# Patient Record
Sex: Male | Born: 1950 | Race: White | Hispanic: No | Marital: Married | Smoking: Never smoker
Health system: Southern US, Community
[De-identification: ages and names within clinical notes are randomized; demographics above are authoritative.]

## PROBLEM LIST (undated history)

## (undated) DIAGNOSIS — K219 Gastro-esophageal reflux disease without esophagitis: Secondary | ICD-10-CM

## (undated) DIAGNOSIS — R519 Headache, unspecified: Secondary | ICD-10-CM

## (undated) DIAGNOSIS — Z8669 Personal history of other diseases of the nervous system and sense organs: Secondary | ICD-10-CM

## (undated) DIAGNOSIS — M1990 Unspecified osteoarthritis, unspecified site: Secondary | ICD-10-CM

## (undated) DIAGNOSIS — E785 Hyperlipidemia, unspecified: Secondary | ICD-10-CM

## (undated) DIAGNOSIS — R51 Headache: Secondary | ICD-10-CM

## (undated) HISTORY — DX: Hyperlipidemia, unspecified: E78.5

## (undated) HISTORY — PX: COLONOSCOPY: SHX174

## (undated) HISTORY — DX: Unspecified osteoarthritis, unspecified site: M19.90

## (undated) HISTORY — PX: POLYPECTOMY: SHX149

## (undated) HISTORY — DX: Personal history of other diseases of the nervous system and sense organs: Z86.69

## (undated) HISTORY — PX: KNEE ARTHROSCOPY: SUR90

---
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
## 1990-10-26 HISTORY — PX: OTHER SURGICAL HISTORY: SHX169

## 2013-06-15 ENCOUNTER — Ambulatory Visit: Payer: Self-pay | Admitting: Family Medicine

## 2013-06-28 ENCOUNTER — Ambulatory Visit (INDEPENDENT_AMBULATORY_CARE_PROVIDER_SITE_OTHER): Payer: Managed Care, Other (non HMO)

## 2013-06-28 ENCOUNTER — Encounter: Payer: Self-pay | Admitting: Physician Assistant

## 2013-06-28 ENCOUNTER — Ambulatory Visit (INDEPENDENT_AMBULATORY_CARE_PROVIDER_SITE_OTHER): Payer: Managed Care, Other (non HMO) | Admitting: Physician Assistant

## 2013-06-28 VITALS — BP 121/70 | HR 60 | Ht 71.0 in | Wt 226.0 lb

## 2013-06-28 DIAGNOSIS — Z131 Encounter for screening for diabetes mellitus: Secondary | ICD-10-CM

## 2013-06-28 DIAGNOSIS — M898X9 Other specified disorders of bone, unspecified site: Secondary | ICD-10-CM

## 2013-06-28 DIAGNOSIS — Z1211 Encounter for screening for malignant neoplasm of colon: Secondary | ICD-10-CM

## 2013-06-28 DIAGNOSIS — M25562 Pain in left knee: Secondary | ICD-10-CM

## 2013-06-28 DIAGNOSIS — IMO0002 Reserved for concepts with insufficient information to code with codable children: Secondary | ICD-10-CM

## 2013-06-28 DIAGNOSIS — Z125 Encounter for screening for malignant neoplasm of prostate: Secondary | ICD-10-CM

## 2013-06-28 DIAGNOSIS — M25569 Pain in unspecified knee: Secondary | ICD-10-CM

## 2013-06-28 DIAGNOSIS — M1710 Unilateral primary osteoarthritis, unspecified knee: Secondary | ICD-10-CM

## 2013-06-28 DIAGNOSIS — Z13220 Encounter for screening for lipoid disorders: Secondary | ICD-10-CM

## 2013-06-28 DIAGNOSIS — B079 Viral wart, unspecified: Secondary | ICD-10-CM

## 2013-06-28 MED ORDER — MELOXICAM 7.5 MG PO TABS: ORAL_TABLET | ORAL | Status: DC

## 2013-06-28 NOTE — Patient Instructions (Addendum)
Knee Pain The knee is the complex joint between your thigh and your lower leg. It is made up of bones, tendons, ligaments, and cartilage. The bones that make up the knee are:  The femur in the thigh.  The tibia and fibula in the lower leg.  The patella or kneecap riding in the groove on the lower femur. CAUSES  Knee pain is a common complaint with many causes. A few of these causes are:  Injury, such as:  A ruptured ligament or tendon injury.  Torn cartilage.  Medical conditions, such as:  Gout  Arthritis  Infections  Overuse, over training or overdoing a physical activity. Knee pain can be minor or severe. Knee pain can accompany debilitating injury. Minor knee problems often respond well to self-care measures or get well on their own. More serious injuries may need medical intervention or even surgery. SYMPTOMS The knee is complex. Symptoms of knee problems can vary widely. Some of the problems are:  Pain with movement and weight bearing.  Swelling and tenderness.  Buckling of the knee.  Inability to straighten or extend your knee.  Your knee locks and you cannot straighten it.  Warmth and redness with pain and fever.  Deformity or dislocation of the kneecap. DIAGNOSIS  Determining what is wrong may be very straight forward such as when there is an injury. It can also be challenging because of the complexity of the knee. Tests to make a diagnosis may include:  Your caregiver taking a history and doing a physical exam.  Routine X-rays can be used to rule out other problems. X-rays will not reveal a cartilage tear. Some injuries of the knee can be diagnosed by:  Arthroscopy a surgical technique by which a small video camera is inserted through tiny incisions on the sides of the knee. This procedure is used to examine and repair internal knee joint problems. Tiny instruments can be used during arthroscopy to repair the torn knee cartilage (meniscus).  Arthrography  is a radiology technique. A contrast liquid is directly injected into the knee joint. Internal structures of the knee joint then become visible on X-ray film.  An MRI scan is a non x-ray radiology procedure in which magnetic fields and a computer produce two- or three-dimensional images of the inside of the knee. Cartilage tears are often visible using an MRI scanner. MRI scans have largely replaced arthrography in diagnosing cartilage tears of the knee.  Blood work.  Examination of the fluid that helps to lubricate the knee joint (synovial fluid). This is done by taking a sample out using a needle and a syringe. TREATMENT The treatment of knee problems depends on the cause. Some of these treatments are:  Depending on the injury, proper casting, splinting, surgery or physical therapy care will be needed.  Give yourself adequate recovery time. Do not overuse your joints. If you begin to get sore during workout routines, back off. Slow down or do fewer repetitions.  For repetitive activities such as cycling or running, maintain your strength and nutrition.  Alternate muscle groups. For example if you are a weight lifter, work the upper body on one day and the lower body the next.  Either tight or weak muscles do not give the proper support for your knee. Tight or weak muscles do not absorb the stress placed on the knee joint. Keep the muscles surrounding the knee strong.  Take care of mechanical problems.  If you have flat feet, orthotics or special shoes may help.   See your caregiver if you need help.  Arch supports, sometimes with wedges on the inner or outer aspect of the heel, can help. These can shift pressure away from the side of the knee most bothered by osteoarthritis.  A brace called an "unloader" brace also may be used to help ease the pressure on the most arthritic side of the knee.  If your caregiver has prescribed crutches, braces, wraps or ice, use as directed. The acronym for  this is PRICE. This means protection, rest, ice, compression and elevation.  Nonsteroidal anti-inflammatory drugs (NSAID's), can help relieve pain. But if taken immediately after an injury, they may actually increase swelling. Take NSAID's with food in your stomach. Stop them if you develop stomach problems. Do not take these if you have a history of ulcers, stomach pain or bleeding from the bowel. Do not take without your caregiver's approval if you have problems with fluid retention, heart failure, or kidney problems.  For ongoing knee problems, physical therapy may be helpful.  Glucosamine and chondroitin are over-the-counter dietary supplements. Both may help relieve the pain of osteoarthritis in the knee. These medicines are different from the usual anti-inflammatory drugs. Glucosamine may decrease the rate of cartilage destruction.  Injections of a corticosteroid drug into your knee joint may help reduce the symptoms of an arthritis flare-up. They may provide pain relief that lasts a few months. You may have to wait a few months between injections. The injections do have a small increased risk of infection, water retention and elevated blood sugar levels.  Hyaluronic acid injected into damaged joints may ease pain and provide lubrication. These injections may work by reducing inflammation. A series of shots may give relief for as long as 6 months.  Topical painkillers. Applying certain ointments to your skin may help relieve the pain and stiffness of osteoarthritis. Ask your pharmacist for suggestions. Many over the-counter products are approved for temporary relief of arthritis pain.  In some countries, doctors often prescribe topical NSAID's for relief of chronic conditions such as arthritis and tendinitis. A review of treatment with NSAID creams found that they worked as well as oral medications but without the serious side effects. PREVENTION  Maintain a healthy weight. Extra pounds put  more strain on your joints.  Get strong, stay limber. Weak muscles are a common cause of knee injuries. Stretching is important. Include flexibility exercises in your workouts.  Be smart about exercise. If you have osteoarthritis, chronic knee pain or recurring injuries, you may need to change the way you exercise. This does not mean you have to stop being active. If your knees ache after jogging or playing basketball, consider switching to swimming, water aerobics or other low-impact activities, at least for a few days a week. Sometimes limiting high-impact activities will provide relief.  Make sure your shoes fit well. Choose footwear that is right for your sport.  Protect your knees. Use the proper gear for knee-sensitive activities. Use kneepads when playing volleyball or laying carpet. Buckle your seat belt every time you drive. Most shattered kneecaps occur in car accidents.  Rest when you are tired. SEEK MEDICAL CARE IF:  You have knee pain that is continual and does not seem to be getting better.  SEEK IMMEDIATE MEDICAL CARE IF:  Your knee joint feels hot to the touch and you have a high fever. MAKE SURE YOU:   Understand these instructions.  Will watch your condition.  Will get help right away if you are not   doing well or get worse. Document Released: 08/09/2007 Document Revised: 01/04/2012 Document Reviewed: 08/09/2007 [facility] Patient Information 2014 [place], [place].   Osteoarthritis Osteoarthritis is the most common form of arthritis. It is redness, soreness, and swelling (inflammation) affecting the cartilage. Cartilage acts as a cushion, covering the ends of bones where they meet to form a joint. CAUSES  Over time, the cartilage begins to wear away. This causes bone to rub on bone. This produces pain and stiffness in the affected joints. Factors that contribute to this problem are:  Excessive body weight.  Age.  Overuse of joints. SYMPTOMS   People with  osteoarthritis usually experience joint pain, swelling, or stiffness.  Over time, the joint may lose its normal shape.  Small deposits of bone (osteophytes) may grow on the edges of the joint.  Bits of bone or cartilage can break off and float inside the joint space. This may cause more pain and damage.  Osteoarthritis can lead to depression, anxiety, feelings of helplessness, and limitations on daily activities. The most commonly affected joints are in the:  Ends of the fingers.  Thumbs.  Neck.  Lower back.  Knees.  Hips. DIAGNOSIS  Diagnosis is mostly based on your symptoms and exam. Tests may be helpful, including:  X-rays of the affected joint.  A computerized magnetic scan (MRI).  Blood tests to rule out other types of arthritis.  Joint fluid tests. This involves using a needle to draw fluid from the joint and examining the fluid under a microscope. TREATMENT  Goals of treatment are to control pain, improve joint function, maintain a normal body weight, and maintain a healthy lifestyle. Treatment approaches may include:  A prescribed exercise program with rest and joint relief.  Weight control with nutritional education.  Pain relief techniques such as:  Properly applied heat and cold.  Electric pulses delivered to nerve endings under the skin (transcutaneous electrical nerve stimulation, TENS).  Massage.  Certain supplements. Ask your caregiver before using any supplements, especially in combination with prescribed drugs.  Medicines to control pain, such as:  Acetaminophen.  Nonsteroidal anti-inflammatory drugs (NSAIDs), such as naproxen.  Narcotic or central-acting agents, such as tramadol. This drug carries a risk of addiction and is generally prescribed for short-term use.  Corticosteroids. These can be given orally or as injection. This is a short-term treatment, not recommended for routine use.  Surgery to reposition the bones and relieve pain  (osteotomy) or to remove loose pieces of bone and cartilage. Joint replacement may be needed in advanced states of osteoarthritis. HOME CARE INSTRUCTIONS  Your caregiver can recommend specific types of exercise. These may include:  Strengthening exercises. These are done to strengthen the muscles that support joints affected by arthritis. They can be performed with weights or with exercise bands to add resistance.  Aerobic activities. These are exercises, such as brisk walking or low-impact aerobics, that get your heart pumping. They can help keep your lungs and circulatory system in shape.  Range-of-motion activities. These keep your joints limber.  Balance and agility exercises. These help you maintain daily living skills. Learning about your condition and being actively involved in your care will help improve the course of your osteoarthritis. SEEK MEDICAL CARE IF:   You feel hot or your skin turns red.  You develop a rash in addition to your joint pain.  You have an oral temperature above 102 F (38.9 C). FOR MORE INFORMATION  National Institute of Arthritis and Musculoskeletal and Skin Diseases: www.niams.http://www.myers.net/ National Institute  on Aging: https://walker.com/ [employer]: www.rheumatology.org Document Released: 10/12/2005 Document Revised: 01/04/2012 Document Reviewed: 01/23/2010 [facility] Patient Information 2014 [place], [place].

## 2013-06-29 LAB — COMPLETE METABOLIC PANEL WITH GFR
ALT: 19 U/L (ref 0–53)
AST: 19 U/L (ref 0–37)
Albumin: 4 g/dL (ref 3.5–5.2)
Alkaline Phosphatase: 46 U/L (ref 39–117)
BUN: 17 mg/dL (ref 6–23)
CO2: 28 mEq/L (ref 19–32)
Calcium: 9 mg/dL (ref 8.4–10.5)
Chloride: 103 mEq/L (ref 96–112)
Creat: 0.98 mg/dL (ref 0.50–1.35)
GFR, Est African American: >89 mL/min
GFR, Est Non African American: 83 mL/min
Glucose, Bld: 82 mg/dL (ref 70–99)
Potassium: 3.9 mEq/L (ref 3.5–5.3)
Sodium: 138 mEq/L (ref 135–145)
Total Bilirubin: 0.6 mg/dL (ref 0.3–1.2)
Total Protein: 6.6 g/dL (ref 6.0–8.3)

## 2013-06-29 LAB — LIPID PANEL
Cholesterol: 167 mg/dL (ref 0–200)
HDL: 45 mg/dL (ref >39)
LDL Cholesterol: 102 mg/dL — ABNORMAL HIGH (ref 0–99)
Total CHOL/HDL Ratio: 3.7 Ratio
Triglycerides: 99 mg/dL (ref <150)
VLDL: 20 mg/dL (ref 0–40)

## 2013-06-29 LAB — PSA, TOTAL AND FREE
PSA, Free Pct: 15 % — ABNORMAL LOW (ref >25)
PSA, Free: 0.35 ng/mL
PSA: 2.29 ng/mL (ref <=4.00)

## 2013-06-30 ENCOUNTER — Encounter: Payer: Self-pay | Admitting: Physician Assistant

## 2013-06-30 NOTE — Progress Notes (Signed)
  Subjective:    Patient ID: Jeffery Anthony, male    DOB: 03/12/1951, 62 y.o.   MRN: [id number]  HPI Patient is a 62 year old male who presents to the clinic to establish care and to discuss some ongoing left knee pain and a lesion on his chest. Patient does not take any ongoing medications. He does take an occasional palms or Excedrin.  Patient's last colonoscopy was in 2000 and 2003 but has been over 10 years. Patient does not remember the last time he has had fasting labs done.  Patient does notice small mole/lesion pop up on the chest about couple months ago. Denies any bleeding. Does appear to be getting larger. Denies any itchy or pain. Not tried anything to make better.   Patient has ongoing left knee pain. He has ran for exercise and fun entire life. He has not been able to run due to  pain. Denies any acute injuries. 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 Knee pain has been the worst for the last 8 months. Not tried anything to make better. He reports that after running or even walking long distances will swell up.    Review of Systems  All other systems reviewed and are negative.       Objective:   Physical Exam  Constitutional: He is oriented to person, place, and time. He appears well-developed and well-nourished.  HENT:  Head: Normocephalic and atraumatic.  Cardiovascular: Normal rate, regular rhythm and normal heart sounds.   Pulmonary/Chest: Effort normal and breath sounds normal.  Musculoskeletal:  Bilateral knee ROM extension and flexion normal. No joint tenderness. Mild swelling over supramedial left knee. Tenderness to palpation over medial side of left knee. Strength 5/5 bilaterally. Patellar reflexes 5/5 bilaterally.  Neurological: He is alert and oriented to person, place, and time.  Skin: Skin is warm and dry.     Psychiatric: He has a normal mood and affect. His behavior is normal.          Assessment & Plan:  Left knee  pain- Will get xrays. I suspect some degenerative changes. Will send mobic. Discussed PT/Ice/glucosamine. Talked about injections as some point.   Wart- Cryotherapy Procedure Note  Pre-operative Diagnosis: wart  Post-operative Diagnosis: wart  Locations: left of midline of upper chest  Indications: irriation  Anesthesia: not required   Procedure Details  History of allergy to iodine: no. Pacemaker? no.  Patient informed of risks (permanent scarring, infection, light or dark discoloration, bleeding, infection, weakness, numbness and recurrence of the lesion) and benefits of the procedure and verbal informed consent obtained.  The areas are treated with liquid nitrogen therapy, frozen until ice ball extended 2 mm beyond lesion, allowed to thaw, and treated again. The patient tolerated procedure well.  The patient was instructed on post-op care, warned that there may be blister formation, redness and pain. Recommend OTC analgesia as needed for pain.  Condition: Stable  Complications: none.  Plan: 1. Instructed to keep the area dry and covered for 24-48h and clean thereafter. 2. Warning signs of infection were reviewed.   3. Recommended that the patient use OTC acetaminophen as needed for pain.  4. Return in 2 weeks if not completely gone can refreeze.   Screening labs order/no urinary symptoms over 50 will order PsA.  Pt needs colonoscopy will refer.

## 2013-07-10 ENCOUNTER — Ambulatory Visit (INDEPENDENT_AMBULATORY_CARE_PROVIDER_SITE_OTHER): Payer: Managed Care, Other (non HMO) | Admitting: Sports Medicine

## 2013-07-10 ENCOUNTER — Encounter: Payer: Self-pay | Admitting: Sports Medicine

## 2013-07-10 VITALS — BP 113/70 | HR 62 | Wt 229.0 lb

## 2013-07-10 DIAGNOSIS — M170 Bilateral primary osteoarthritis of knee: Secondary | ICD-10-CM | POA: Insufficient documentation

## 2013-07-10 DIAGNOSIS — IMO0002 Reserved for concepts with insufficient information to code with codable children: Secondary | ICD-10-CM

## 2013-07-10 DIAGNOSIS — M1710 Unilateral primary osteoarthritis, unspecified knee: Secondary | ICD-10-CM

## 2013-07-10 NOTE — Assessment & Plan Note (Signed)
Has not yet tried any conservative measures. He will start Mobic which his primary provider has prescribed, home exercises, icing, knee compression sleeve. Return to see me in approximately one to 2 months, if no better we can start interventional treatment.

## 2013-07-10 NOTE — Progress Notes (Signed)
   Subjective:    I'm seeing this patient as a consultation for:  Tandy Gaw, PA-C  CC: Left knee pain  HPI: This is a very pleasant 62 year old male with a long history of left knee pain associated with swelling without mechanical symptoms. Pain is predominately along the medial joint line, without radiation. Moderate, persistent, associated with gelling and morning stiffness. He has some meloxicam prescribed by his primary provider which he has not yet taken. He does desire to continue running, but has been unable to do to his pain. He has never had injections, never had this for supplementation.  Past medical history, Surgical history, Family history not pertinant except as noted below, Social history, Allergies, and medications have been entered into the medical record, reviewed, and no changes needed.   Review of Systems: No headache, visual changes, nausea, vomiting, diarrhea, constipation, dizziness, abdominal pain, skin rash, fevers, chills, night sweats, weight loss, swollen lymph nodes, body aches, joint swelling, muscle aches, chest pain, shortness of breath, mood changes, visual or auditory hallucinations.   Objective:   General: Well Developed, well nourished, and in no acute distress.  Neuro/Psych: Alert and oriented x3, extra-ocular muscles intact, able to move all 4 extremities, sensation grossly intact. Skin: Warm and dry, no rashes noted.  Respiratory: Not using accessory muscles, speaking in full sentences, trachea midline.  Cardiovascular: Pulses palpable, no extremity edema. Abdomen: Does not appear distended. Left Knee: Visibly swollen with tenderness to palpation along the medial joint line. ROM full in flexion and extension and lower leg rotation. Ligaments with solid consistent endpoints including ACL, PCL, LCL, MCL. Negative Mcmurray's, Apley's, and Thessalonian tests. Non painful patellar compression. Patellar glide without crepitus. Patellar and quadriceps  tendons unremarkable. Hamstring and quadriceps strength is normal.   X-rays were reviewed personally, and the left knee there is significant cartilage loss in the medial compartment.  Impression and Recommendations:   This case required medical decision making of moderate complexity.

## 2013-07-20 ENCOUNTER — Encounter: Payer: Self-pay | Admitting: Internal Medicine

## 2013-09-15 ENCOUNTER — Ambulatory Visit (AMBULATORY_SURGERY_CENTER): Payer: Self-pay | Admitting: *Deleted

## 2013-09-15 VITALS — Ht 71.0 in | Wt 237.4 lb

## 2013-09-15 DIAGNOSIS — Z1211 Encounter for screening for malignant neoplasm of colon: Secondary | ICD-10-CM

## 2013-09-15 MED ORDER — MOVIPREP 100 G PO SOLR: ORAL | Status: DC

## 2013-09-15 NOTE — Progress Notes (Signed)
No allergies to eggs or soy. No problems with anesthesia.  

## 2013-09-20 ENCOUNTER — Encounter: Payer: Self-pay | Admitting: Internal Medicine

## 2013-09-29 ENCOUNTER — Ambulatory Visit (AMBULATORY_SURGERY_CENTER): Payer: Managed Care, Other (non HMO) | Admitting: Internal Medicine

## 2013-09-29 ENCOUNTER — Encounter: Payer: Self-pay | Admitting: Internal Medicine

## 2013-09-29 VITALS — BP 126/79 | HR 54 | Temp 97.9°F | Resp 14 | Ht 71.0 in | Wt 237.0 lb

## 2013-09-29 DIAGNOSIS — D126 Benign neoplasm of colon, unspecified: Secondary | ICD-10-CM

## 2013-09-29 DIAGNOSIS — Z1211 Encounter for screening for malignant neoplasm of colon: Secondary | ICD-10-CM

## 2013-09-29 MED ORDER — SODIUM CHLORIDE 0.9 % IV SOLN: 500.0000 mL | INTRAVENOUS | Status: DC

## 2013-09-29 NOTE — Patient Instructions (Signed)
YOU HAD AN ENDOSCOPIC PROCEDURE TODAY AT THE [facility]: Refer to the procedure report that was given to you for any specific questions about what was found during the examination.  If the procedure report does not answer your questions, please call your gastroenterologist to clarify.  If you requested that your care partner not be given the details of your procedure findings, then the procedure report has been included in a sealed envelope for you to review at your convenience later.  YOU SHOULD EXPECT: Some feelings of bloating in the abdomen. Passage of more gas than usual.  Walking can help get rid of the air that was put into your GI tract during the procedure and reduce the bloating. If you had a lower endoscopy (such as a colonoscopy or flexible sigmoidoscopy) you may notice spotting of blood in your stool or on the toilet paper. If you underwent a bowel prep for your procedure, then you may not have a normal bowel movement for a few days.  DIET: Your first meal following the procedure should be a light meal and then it is ok to progress to your normal diet.  A half-sandwich or bowl of soup is an example of a good first meal.  Heavy or fried foods are harder to digest and may make you feel nauseous or bloated.  Likewise meals heavy in dairy and vegetables can cause extra gas to form and this can also increase the bloating.  Drink plenty of fluids but you should avoid alcoholic beverages for 24 hours.  ACTIVITY: Your care partner should take you home directly after the procedure.  You should plan to take it easy, moving slowly for the rest of the day.  You can resume normal activity the day after the procedure however you should NOT DRIVE or use heavy machinery for 24 hours (because of the sedation medicines used during the test).    SYMPTOMS TO REPORT IMMEDIATELY: A gastroenterologist can be reached at any hour.  During normal business hours, 8:30 AM to 5:00 PM Monday through Friday,  call [phone].  After hours and on weekends, please call the GI answering service at [phone] who will take a message and have the physician on call contact you.   Following lower endoscopy (colonoscopy or flexible sigmoidoscopy):  Excessive amounts of blood in the stool  Significant tenderness or worsening of abdominal pains  Swelling of the abdomen that is new, acute  Fever of 100F or higher  FOLLOW UP: If any biopsies were taken you will be contacted by phone or by letter within the next 1-3 weeks.  Call your gastroenterologist if you have not heard about the biopsies in 3 weeks.  Our staff will call the home number listed on your records the next business day following your procedure to check on you and address any questions or concerns that you may have at that time regarding the information given to you following your procedure. This is a courtesy call and so if there is no answer at the home number and we have not heard from you through the emergency physician on call, we will assume that you have returned to your regular daily activities without incident.  SIGNATURES/CONFIDENTIALITY: You and/or your care partner have signed paperwork which will be entered into your electronic medical record.  These signatures attest to the fact that that the information above on your After Visit Summary has been reviewed and is understood.  Full responsibility of the confidentiality of this   discharge information lies with you and/or your care-partner.  Recommendations See procedure report  

## 2013-09-29 NOTE — Op Note (Signed)
[facility] [address] [place], [address]   COLONOSCOPY PROCEDURE REPORT  PATIENT: Jeffery, Anthony  MR#: [id number] BIRTHDATE: 09-29-1951 , 62  yrs. old GENDER: Male ENDOSCOPIST: Roxy Cedar, MD REFERRED NW:[facility] PROCEDURE DATE:  09/29/2013 PROCEDURE:   Colonoscopy with snare polypectomy x 1 First Screening Colonoscopy - Avg.  risk and is 50 yrs.  old or older - No.  Prior Negative Screening - Now for repeat screening. N/A  History of Adenoma - Now for follow-up colonoscopy & has been > or = to 3 yrs.  N/A  Polyps Removed Today? Yes. ASA CLASS:   Class II INDICATIONS:average risk screening. Reports screening colonoscopy elsewhere 10-12 years ago. Results unknown. MEDICATIONS: MAC sedation, administered by CRNA and propofol (Diprivan) 300mg  IV  DESCRIPTION OF PROCEDURE:   After the risks benefits and alternatives of the procedure were thoroughly explained, informed consent was obtained.  A digital rectal exam revealed no abnormalities of the rectum.   The LB [id number]  endoscope was introduced through the anus and advanced to the cecum, which was identified by both the appendix and ileocecal valve. No adverse events experienced.   The quality of the prep was good, using MoviPrep  The instrument was then slowly withdrawn as the colon was fully examined.   COLON FINDINGS: A single polyp measuring 5 mm in size was found in the sigmoid colon.  A polypectomy was performed with a cold snare. The resection was complete and the polyp tissue was completely retrieved.   The colon was otherwise normal.  There was no diverticulosis, inflammation, other polyps or cancers.  Retroflexed views revealed internal hemorrhoids. The time to cecum=3 minutes 28 seconds.  Withdrawal time=12 minutes 11 seconds.  The scope was withdrawn and the procedure completed.  COMPLICATIONS: There were no complications.  ENDOSCOPIC IMPRESSION: 1.   Single polyp  measuring 5 mm in size was found in the sigmoid colon; polypectomy was performed with a cold snare 2.   The colon was otherwise normal  RECOMMENDATIONS: 1. Repeat colonoscopy in 5 years if polyp adenomatous; otherwise 10 years   eSigned:  Roxy Cedar, MD 09/29/2013 11:00 AM   cc: The Patient    ; Tandy Gaw, PA-C [facility])

## 2013-09-29 NOTE — Progress Notes (Signed)
Called to room to assist during endoscopic procedure.  Patient ID and intended procedure confirmed with present staff. Received instructions for my participation in the procedure from the performing physician.  

## 2013-09-29 NOTE — Progress Notes (Signed)
Patient did not have preoperative order for IV antibiotic SSI prophylaxis. (G8918)  Patient did not experience any of the following events: a burn prior to discharge; a fall within the facility; wrong site/side/patient/procedure/implant event; or a hospital transfer or hospital admission upon discharge from the facility. (G8907)  

## 2013-10-02 ENCOUNTER — Telehealth: Payer: Self-pay | Admitting: *Deleted

## 2013-10-02 NOTE — Telephone Encounter (Signed)
Left message that we called for f/u 

## 2013-10-03 ENCOUNTER — Encounter: Payer: Self-pay | Admitting: Internal Medicine

## 2013-10-04 ENCOUNTER — Encounter: Payer: Self-pay | Admitting: Physician Assistant

## 2013-10-04 DIAGNOSIS — K635 Polyp of colon: Secondary | ICD-10-CM | POA: Insufficient documentation

## 2016-06-22 ENCOUNTER — Ambulatory Visit (INDEPENDENT_AMBULATORY_CARE_PROVIDER_SITE_OTHER): Payer: Managed Care, Other (non HMO)

## 2016-06-22 ENCOUNTER — Ambulatory Visit (INDEPENDENT_AMBULATORY_CARE_PROVIDER_SITE_OTHER): Payer: Managed Care, Other (non HMO) | Admitting: Physician Assistant

## 2016-06-22 ENCOUNTER — Encounter: Payer: Self-pay | Admitting: Physician Assistant

## 2016-06-22 VITALS — BP 118/86 | HR 78 | Ht 71.0 in | Wt 247.0 lb

## 2016-06-22 DIAGNOSIS — R05 Cough: Secondary | ICD-10-CM

## 2016-06-22 DIAGNOSIS — R059 Cough, unspecified: Secondary | ICD-10-CM

## 2016-06-22 MED ORDER — HYDROCOD POLST-CPM POLST ER 10-8 MG/5ML PO SUER: 5.0000 mL | Freq: Two times a day (BID) | ORAL | 0 refills | Status: DC | PRN

## 2016-06-22 MED ORDER — PREDNISONE 20 MG PO TABS: ORAL_TABLET | ORAL | 0 refills | Status: DC

## 2016-06-22 NOTE — Progress Notes (Signed)
   Subjective:    Patient ID: Jeffery Anthony, male    DOB: 11/28/1950, 65 y.o.   MRN: [id number]  HPI  Patient is a 65 year old male who presents to the clinic with ongoing cough for the last month. [facility] has [facility] for similar symptoms. He was diagnosed with a sinus infection and then pneumonia. He has taken Augmentin for 10 days and a Z-Pak for 5 days. He was given Ladona Ridgel for cough. He denies any fever, chills, wheezing or shortness of breath. Cough is not productive. It is affecting his ability to sleep. It is also affecting his ability to go to work since he is coughing all the time.   Review of Systems See HPI.     Objective:   Physical Exam  Constitutional: He is oriented to person, place, and time. He appears well-developed and well-nourished.  HENT:  Head: Normocephalic and atraumatic.  Right Ear: External ear normal.  Left Ear: External ear normal.  Nose: Nose normal.  Mouth/Throat: Oropharynx is clear and moist. No oropharyngeal exudate.  TM"s clear.  Negative for sinus tenderness to palpation.   Eyes: Conjunctivae are normal. Right eye exhibits no discharge. Left eye exhibits no discharge.  Neck: Normal range of motion. Neck supple.  Cardiovascular: Normal rate, regular rhythm and normal heart sounds.   Pulmonary/Chest: Effort normal and breath sounds normal. He has no wheezes.  Lymphadenopathy:    He has no cervical adenopathy.  Neurological: He is alert and oriented to person, place, and time.  Psychiatric: He has a normal mood and affect. His behavior is normal.          Assessment & Plan:  Cough- Reassured patient that his exam today was benign. I would like to go ahead and get a chest x-ray since he has not had this indents [facility]. I do not see any signs of infection. I would like for him to try round of prednisone. Also gave him a cough syrup to use at bedtime. HO given. Follow up if not improving.

## 2016-06-22 NOTE — Patient Instructions (Signed)

## 2016-07-28 ENCOUNTER — Ambulatory Visit (INDEPENDENT_AMBULATORY_CARE_PROVIDER_SITE_OTHER): Payer: Managed Care, Other (non HMO) | Admitting: Sports Medicine

## 2016-07-28 ENCOUNTER — Encounter: Payer: Self-pay | Admitting: Sports Medicine

## 2016-07-28 DIAGNOSIS — M170 Bilateral primary osteoarthritis of knee: Secondary | ICD-10-CM | POA: Diagnosis not present

## 2016-07-28 NOTE — Assessment & Plan Note (Signed)
Injection into the left knee. He will work on weight loss.  Return in one month. Declines oral medication.

## 2016-07-28 NOTE — Progress Notes (Signed)
   Subjective:    I'm seeing this patient as a consultation for:  Iran Planas, PA-C  CC: Knee pain  HPI: This is a pleasant 65 year old male, treated 3 years ago for primary osteoarthritis of both knees, he really wasn't compliant with rehabilitation exercises or medications, having persistent pain, medial joint line, moderate, persistent without radiation, no mechanical symptoms, no trauma. Significant gelling.  Past medical history:  Negative.  See flowsheet/record as well for more information.  Surgical history: Negative.  See flowsheet/record as well for more information.  Family history: Negative.  See flowsheet/record as well for more information.  Social history: Negative.  See flowsheet/record as well for more information.  Allergies, and medications have been entered into the medical record, reviewed, and no changes needed.   Review of Systems: No headache, visual changes, nausea, vomiting, diarrhea, constipation, dizziness, abdominal pain, skin rash, fevers, chills, night sweats, weight loss, swollen lymph nodes, body aches, joint swelling, muscle aches, chest pain, shortness of breath, mood changes, visual or auditory hallucinations.   Objective:   General: Well Developed, well nourished, and in no acute distress.  Neuro/Psych: Alert and oriented x3, extra-ocular muscles intact, able to move all 4 extremities, sensation grossly intact. Skin: Warm and dry, no rashes noted.  Respiratory: Not using accessory muscles, speaking in full sentences, trachea midline.  Cardiovascular: Pulses palpable, no extremity edema. Abdomen: Does not appear distended. Left Knee: Normal to inspection with no erythema or effusion or obvious bony abnormalities. Palpation normal with no warmth or joint line tenderness or patellar tenderness or condyle tenderness. ROM normal in flexion and extension and lower leg rotation. Ligaments with solid consistent endpoints including ACL, PCL, LCL,  MCL. Negative Mcmurray's and provocative meniscal tests. Non painful patellar compression. Patellar and quadriceps tendons unremarkable. Hamstring and quadriceps strength is normal.  Procedure: Real-time Ultrasound Guided Injection of left knee Device: GE Logiq E  Verbal informed consent obtained.  Time-out conducted.  Noted no overlying erythema, induration, or other signs of local infection.  Skin prepped in a sterile fashion.  Local anesthesia: Topical Ethyl chloride.  With sterile technique and under real time ultrasound guidance:  1 mL kenalog 40, 2 mL lidocaine, 2 mL Marcaine injected easily Completed without difficulty  Pain immediately resolved suggesting accurate placement of the medication.  Advised to call if fevers/chills, erythema, induration, drainage, or persistent bleeding.  Images permanently stored and available for review in the ultrasound unit.  Impression: Technically successful ultrasound guided injection.  Impression and Recommendations:   This case required medical decision making of moderate complexity.  Primary osteoarthritis of both knees Injection into the left knee. He will work on weight loss.  Return in one month. Declines oral medication.

## 2016-08-25 ENCOUNTER — Ambulatory Visit (INDEPENDENT_AMBULATORY_CARE_PROVIDER_SITE_OTHER): Payer: Managed Care, Other (non HMO) | Admitting: Sports Medicine

## 2016-08-25 ENCOUNTER — Encounter: Payer: Self-pay | Admitting: Sports Medicine

## 2016-08-25 DIAGNOSIS — M170 Bilateral primary osteoarthritis of knee: Secondary | ICD-10-CM

## 2016-08-25 NOTE — Assessment & Plan Note (Signed)
Left knee injection worked well, nearly pain-free. He has also lost 10 pounds. He will return for custom orthotics, essentially pain-free with all activities except for running. Declines NSAIDs.

## 2016-08-25 NOTE — Progress Notes (Signed)
  Subjective:    CC: Follow-up  HPI: This is a pleasant 65 year old male, we injected his left knee at the last visit, he is pain-free for all activities with the exception of running, agrees to proceed with custom orthotics. He is also lost 10 pounds, declines oral NSAIDs.  Past medical history:  Negative.  See flowsheet/record as well for more information.  Surgical history: Negative.  See flowsheet/record as well for more information.  Family history: Negative.  See flowsheet/record as well for more information.  Social history: Negative.  See flowsheet/record as well for more information.  Allergies, and medications have been entered into the medical record, reviewed, and no changes needed.   Review of Systems: No fevers, chills, night sweats, weight loss, chest pain, or shortness of breath.   Objective:    General: Well Developed, well nourished, and in no acute distress.  Neuro: Alert and oriented x3, extra-ocular muscles intact, sensation grossly intact.  HEENT: Normocephalic, atraumatic, pupils equal round reactive to light, neck supple, no masses, no lymphadenopathy, thyroid nonpalpable.  Skin: Warm and dry, no rashes. Cardiac: Regular rate and rhythm, no murmurs rubs or gallops, no lower extremity edema.  Respiratory: Clear to auscultation bilaterally. Not using accessory muscles, speaking in full sentences.  Impression and Recommendations:    Primary osteoarthritis of both knees Left knee injection worked well, nearly pain-free. He has also lost 10 pounds. He will return for custom orthotics, essentially pain-free with all activities except for running. Declines NSAIDs.  I spent 25 minutes with this patient, greater than 50% was face-to-face time counseling regarding the above diagnoses

## 2016-12-01 ENCOUNTER — Ambulatory Visit (INDEPENDENT_AMBULATORY_CARE_PROVIDER_SITE_OTHER): Payer: Commercial Managed Care - PPO | Admitting: Osteopathic Medicine

## 2016-12-01 ENCOUNTER — Encounter: Payer: Self-pay | Admitting: Osteopathic Medicine

## 2016-12-01 VITALS — BP 128/88 | HR 63 | Ht 71.0 in | Wt 247.0 lb

## 2016-12-01 DIAGNOSIS — G43811 Other migraine, intractable, with status migrainosus: Secondary | ICD-10-CM | POA: Diagnosis not present

## 2016-12-01 DIAGNOSIS — R51 Headache: Secondary | ICD-10-CM

## 2016-12-01 DIAGNOSIS — G43909 Migraine, unspecified, not intractable, without status migrainosus: Secondary | ICD-10-CM | POA: Insufficient documentation

## 2016-12-01 DIAGNOSIS — R519 Headache, unspecified: Secondary | ICD-10-CM

## 2016-12-01 MED ORDER — SUMATRIPTAN SUCCINATE 50 MG PO TABS: 50.0000 mg | ORAL_TABLET | ORAL | 1 refills | Status: DC | PRN

## 2016-12-01 MED ORDER — BUTALBITAL-APAP-CAFFEINE 50-325-40 MG PO TABS: 1.0000 | ORAL_TABLET | Freq: Two times a day (BID) | ORAL | 0 refills | Status: DC | PRN

## 2016-12-01 MED ORDER — KETOROLAC TROMETHAMINE 60 MG/2ML IM SOLN
60.0000 mg | Freq: Once | INTRAMUSCULAR | Status: AC
  Administered 2016-12-01: 60 mg via INTRAMUSCULAR

## 2016-12-01 MED ORDER — PROMETHAZINE HCL 25 MG/ML IJ SOLN
25.0000 mg | Freq: Once | INTRAMUSCULAR | Status: AC
  Administered 2016-12-01: 25 mg via INTRAMUSCULAR

## 2016-12-01 NOTE — Patient Instructions (Signed)
Plan  In office: shots of Toradol and Phenergan to help acute migraine  At pharmacy:  Imitrex: new Rx to have on hand to take at beginning of migraine  Fioricet: new Rx to continue over the next 1-2 days if needed for headache  If symptoms persist, change or worsen, we should get imaging of the brain,so  if you're not feeling better by tomorrow, or if you're worse in the meantime, please call us or go to the ER!   There are medication options for frequent migraine/tension headache prevention, if you are getting a lot of headaches, please follow up with your PCP about this issue!       Migraine Headache A migraine headache is an intense, throbbing pain on one side or both sides of the head. Migraines may also cause other symptoms, such as nausea, vomiting, and sensitivity to light and noise. What are the causes? Doing or taking certain things may also trigger migraines, such as:  Alcohol.  Smoking.  Medicines, such as:  Medicine used to treat chest pain (nitroglycerine).  Birth control pills.  Estrogen pills.  Certain blood pressure medicines.  Aged cheeses, chocolate, or caffeine.  Foods or drinks that contain nitrates, glutamate, aspartame, or tyramine.  Physical activity. Other things that may trigger a migraine include:  Menstruation.  Pregnancy.  Hunger.  Stress, lack of sleep, too much sleep, or fatigue.  Weather changes. What increases the risk? The following factors may make you more likely to experience migraine headaches:  Age. Risk increases with age.  Family history of migraine headaches.  Being Caucasian.  Depression and anxiety.  Obesity.  Being a woman.  Having a hole in the heart (patent foramen ovale) or other heart problems. What are the signs or symptoms? The main symptom of this condition is pulsating or throbbing pain. Pain may:  Happen in any area of the head, such as on one side or both sides.  Interfere with daily  activities.  Get worse with physical activity.  Get worse with exposure to bright lights or loud noises. Other symptoms may include:  Nausea.  Vomiting.  Dizziness.  General sensitivity to bright lights, loud noises, or smells. Before you get a migraine, you may get warning signs that a migraine is developing (aura). An aura may include:  Seeing flashing lights or having blind spots.  Seeing bright spots, halos, or zigzag lines.  Having tunnel vision or blurred vision.  Having numbness or a tingling feeling.  Having trouble talking.  Having muscle weakness. How is this diagnosed? A migraine headache can be diagnosed based on:  Your symptoms.  A physical exam.  Tests, such as CT scan or MRI of the head. These imaging tests can help rule out other causes of headaches.  Taking fluid from the spine (lumbar puncture) and analyzing it (cerebrospinal fluid analysis, or CSF analysis). How is this treated? A migraine headache is usually treated with medicines that:  Relieve pain.  Relieve nausea.  Prevent migraines from coming back. Treatment may also include:  Acupuncture.  Lifestyle changes like avoiding foods that trigger migraines. Follow these instructions at home: Medicines  Take over-the-counter and prescription medicines only as told by your health care provider.  Do not drive or use heavy machinery while taking prescription pain medicine.  To prevent or treat constipation while you are taking prescription pain medicine, your health care provider may recommend that you:  Drink enough fluid to keep your urine clear or pale yellow.  Take over-the-counter or  prescription medicines.  Eat foods that are high in fiber, such as fresh fruits and vegetables, whole grains, and beans.  Limit foods that are high in fat and processed sugars, such as fried and sweet foods. Lifestyle  Avoid alcohol use.  Do not use any products that contain nicotine or tobacco,  such as cigarettes and e-cigarettes. If you need help quitting, ask your health care provider.  Get at least 8 hours of sleep every night.  Limit your stress. General instructions  Keep a journal to find out what may trigger your migraine headaches. For example, write down:  What you eat and drink.  How much sleep you get.  Any change to your diet or medicines.  If you have a migraine:  Avoid things that make your symptoms worse, such as bright lights.  It may help to lie down in a dark, quiet room.  Do not drive or use heavy machinery.  Ask your health care provider what activities are safe for you while you are experiencing symptoms.  Keep all follow-up visits as told by your health care provider. This is important. Contact a health care provider if:  You develop symptoms that are different or more severe than your usual migraine symptoms. Get help right away if:  Your migraine becomes severe.  You have a fever.  You have a stiff neck.  You have vision loss.  Your muscles feel weak or like you cannot control them.  You start to lose your balance often.  You develop trouble walking.  You faint. This information is not intended to replace advice given to you by your health care provider. Make sure you discuss any questions you have with your health care provider. Document Released: 10/12/2005 Document Revised: 05/01/2016 Document Reviewed: 03/30/2016 Elsevier Interactive Patient Education  2017 [employer].

## 2016-12-01 NOTE — Progress Notes (Signed)
HPI: Jeffery Anthony is a 66 y.o. male  who presents to [facility] today, 12/01/16,  for chief complaint of:  Chief Complaint  Patient presents with  . Migraine    Headache . Context: Migraine is not noted on his problem list the patient has stated to previous providers that he occasionally suffers from migraines but has never had to be on prescription medication for this. Presents to clinic today with serious migraine symptoms.  . Location: starts at base of neck and goes up both sides into temple and into the eyes, same on both sides.  . Quality: throbbing . Assoc signs/symptoms: see ROS - standing = dizziness, sitting = nausea, sensitive to noise and light.  . Duration: 4 days  Timing: usually once per quarter  Or so, usually a day or so severe and then the second day will be a dull headache.  . Modifying factors: has tried the following OTC/Rx medications: usually the only thing that works is Excedrin or Excedrin migraine, this dulls it but still there.   Past medical history, surgical history, social history and family history reviewed.  Patient Active Problem List   Diagnosis Date Noted  . Colon polyp 10/04/2013  . Primary osteoarthritis of both knees 07/10/2013    Current medication list and allergy/intolerance information reviewed.   No current outpatient prescriptions on file prior to visit.   No current facility-administered medications on file prior to visit.    Allergies  Allergen Reactions  . Ivp Dye [Iodinated Diagnostic Agents] Nausea And Vomiting    Comment: iodine dye      Review of Systems:  Constitutional: No recent illness  HEENT: +headache, no vision change  Cardiac: No  chest pain, No  pressure, No palpitations  Respiratory:  No  shortness of breath. No  Cough  Musculoskeletal: No new myalgia/arthralgia  Neurologic: No  weakness, +Dizziness with standing   Exam:  BP 128/88   Pulse 63   Ht 5\' 11"  (1.803 m)    Wt 247 lb (112 kg)   BMI 34.45 kg/m   Constitutional: VS see above. General Appearance: alert, well-developed, well-nourished, NAD  Eyes: Normal lids and conjunctive, non-icteric sclera  Ears, Nose, Mouth, Throat: MMM, Normal external inspection ears/nares/mouth/lips/gums.  Neck: No masses, trachea midline.   Respiratory: Normal respiratory effort. no wheeze, no rhonchi, no rales  Cardiovascular: S1/S2 normal, no murmur, no rub/gallop auscultated. RRR.   Musculoskeletal: Gait normal. Symmetric and independent movement of all extremities  Neurological: Normal balance/coordination. No tremor. EOMI, PERRLA. Cerebellar reflexes intact. Motor and sensation intact and symmetric on both eyes in all 4 extremities. No cranial nerve deficit on limited exam.  Skin: warm, dry, intact.   Psychiatric: Normal judgment/insight. Normal mood and affect. Oriented x3.      ASSESSMENT/PLAN:   Other migraine with status migrainosus, intractable  Acute intractable headache, unspecified headache type - Offered CT today but patient declines. ER precautions reviewed. If no better by tomorrow we'll need to consider urgent imaging. - Plan: SUMAtriptan (IMITREX) 50 MG tablet, butalbital-acetaminophen-caffeine (FIORICET, ESGIC) 50-325-40 MG tablet, promethazine (PHENERGAN) injection 25 mg, ketorolac (TORADOL) injection 60 mg    Patient Instructions  Plan  In office: shots of Toradol and Phenergan to help acute migraine  At pharmacy:  Imitrex: new Rx to have on hand to take at beginning of migraine  Fioricet: new Rx to continue over the next 1-2 days if needed for headache  If symptoms persist, change or worsen, we should get  imaging of the brain,so  if you're not feeling better by tomorrow, or if you're worse in the meantime, please call us or go to the ER!   There are medication options for frequent migraine/tension headache prevention, if you are getting a lot of headaches, please follow up with  your PCP about this issue!       Migraine Headache A migraine headache is an intense, throbbing pain on one side or both sides of the head. Migraines may also cause other symptoms, such as nausea, vomiting, and sensitivity to light and noise. What are the causes? Doing or taking certain things may also trigger migraines, such as:  Alcohol.  Smoking.  Medicines, such as:  Medicine used to treat chest pain (nitroglycerine).  Birth control pills.  Estrogen pills.  Certain blood pressure medicines.  Aged cheeses, chocolate, or caffeine.  Foods or drinks that contain nitrates, glutamate, aspartame, or tyramine.  Physical activity. Other things that may trigger a migraine include:  Menstruation.  Pregnancy.  Hunger.  Stress, lack of sleep, too much sleep, or fatigue.  Weather changes. What increases the risk? The following factors may make you more likely to experience migraine headaches:  Age. Risk increases with age.  Family history of migraine headaches.  Being Caucasian.  Depression and anxiety.  Obesity.  Being a woman.  Having a hole in the heart (patent foramen ovale) or other heart problems. What are the signs or symptoms? The main symptom of this condition is pulsating or throbbing pain. Pain may:  Happen in any area of the head, such as on one side or both sides.  Interfere with daily activities.  Get worse with physical activity.  Get worse with exposure to bright lights or loud noises. Other symptoms may include:  Nausea.  Vomiting.  Dizziness.  General sensitivity to bright lights, loud noises, or smells. Before you get a migraine, you may get warning signs that a migraine is developing (aura). An aura may include:  Seeing flashing lights or having blind spots.  Seeing bright spots, halos, or zigzag lines.  Having tunnel vision or blurred vision.  Having numbness or a tingling feeling.  Having trouble talking.  Having  muscle weakness. How is this diagnosed? A migraine headache can be diagnosed based on:  Your symptoms.  A physical exam.  Tests, such as CT scan or MRI of the head. These imaging tests can help rule out other causes of headaches.  Taking fluid from the spine (lumbar puncture) and analyzing it (cerebrospinal fluid analysis, or CSF analysis). How is this treated? A migraine headache is usually treated with medicines that:  Relieve pain.  Relieve nausea.  Prevent migraines from coming back. Treatment may also include:  Acupuncture.  Lifestyle changes like avoiding foods that trigger migraines. Follow these instructions at home: Medicines  Take over-the-counter and prescription medicines only as told by your health care provider.  Do not drive or use heavy machinery while taking prescription pain medicine.  To prevent or treat constipation while you are taking prescription pain medicine, your health care provider may recommend that you:  Drink enough fluid to keep your urine clear or pale yellow.  Take over-the-counter or prescription medicines.  Eat foods that are high in fiber, such as fresh fruits and vegetables, whole grains, and beans.  Limit foods that are high in fat and processed sugars, such as fried and sweet foods. Lifestyle  Avoid alcohol use.  Do not use any products that contain nicotine or tobacco, such  as cigarettes and e-cigarettes. If you need help quitting, ask your health care provider.  Get at least 8 hours of sleep every night.  Limit your stress. General instructions  Keep a journal to find out what may trigger your migraine headaches. For example, write down:  What you eat and drink.  How much sleep you get.  Any change to your diet or medicines.  If you have a migraine:  Avoid things that make your symptoms worse, such as bright lights.  It may help to lie down in a dark, quiet room.  Do not drive or use heavy machinery.  Ask  your health care provider what activities are safe for you while you are experiencing symptoms.  Keep all follow-up visits as told by your health care provider. This is important. Contact a health care provider if:  You develop symptoms that are different or more severe than your usual migraine symptoms. Get help right away if:  Your migraine becomes severe.  You have a fever.  You have a stiff neck.  You have vision loss.  Your muscles feel weak or like you cannot control them.  You start to lose your balance often.  You develop trouble walking.  You faint. This information is not intended to replace advice given to you by your health care provider. Make sure you discuss any questions you have with your health care provider. Document Released: 10/12/2005 Document Revised: 05/01/2016 Document Reviewed: 03/30/2016 Elsevier Interactive Patient Education  2017 [employer].     Follow-up plan: Return if symptoms worsen or fail to improve.  Visit summary with medication list and pertinent instructions was printed for patient to review, alert [place] if any changes needed. All questions at time of visit were answered - patient instructed to contact office with any additional concerns. ER/RTC precautions were reviewed with the patient and understanding verbalized.

## 2016-12-03 ENCOUNTER — Telehealth: Payer: Self-pay | Admitting: Osteopathic Medicine

## 2016-12-03 ENCOUNTER — Telehealth: Payer: Self-pay

## 2016-12-03 DIAGNOSIS — G43911 Migraine, unspecified, intractable, with status migrainosus: Secondary | ICD-10-CM

## 2016-12-03 NOTE — Telephone Encounter (Signed)
In case needed for MRI

## 2016-12-03 NOTE — Telephone Encounter (Signed)
In that case, he needs imaging, preferably MRI. Is there anywhere we can get him in for an urgent MRI brain with & without contrast today? If not, we can get a CT downstairs. Can we check up on this?

## 2016-12-03 NOTE — Telephone Encounter (Signed)
Left information on Pt's VM. callback provided for any questions.

## 2016-12-03 NOTE — Telephone Encounter (Signed)
Please call patient: I just received a call that MRI report showed no intracranial abnormality. If his pain is still uncontrolled he should go to the emergency room, he may require treatment with IV medications.

## 2016-12-03 NOTE — Telephone Encounter (Signed)
Jeffery Anthony, Bill's wife, called and reports his migraines is not better. He had to leave work today due to migraine. Please advise.

## 2016-12-03 NOTE — Telephone Encounter (Signed)
Patient is on his way to [facility] for MRI.

## 2017-07-06 ENCOUNTER — Ambulatory Visit (INDEPENDENT_AMBULATORY_CARE_PROVIDER_SITE_OTHER): Payer: Commercial Managed Care - PPO | Admitting: Physician Assistant

## 2017-07-06 ENCOUNTER — Encounter: Payer: Self-pay | Admitting: Physician Assistant

## 2017-07-06 VITALS — BP 122/83 | HR 72 | Ht 71.0 in | Wt 232.0 lb

## 2017-07-06 DIAGNOSIS — Z1159 Encounter for screening for other viral diseases: Secondary | ICD-10-CM

## 2017-07-06 DIAGNOSIS — T881XXA Other complications following immunization, not elsewhere classified, initial encounter: Secondary | ICD-10-CM

## 2017-07-06 DIAGNOSIS — Z131 Encounter for screening for diabetes mellitus: Secondary | ICD-10-CM | POA: Diagnosis not present

## 2017-07-06 DIAGNOSIS — G610 Guillain-Barre syndrome: Secondary | ICD-10-CM

## 2017-07-06 DIAGNOSIS — M79672 Pain in left foot: Secondary | ICD-10-CM | POA: Diagnosis not present

## 2017-07-06 DIAGNOSIS — Z0000 Encounter for general adult medical examination without abnormal findings: Secondary | ICD-10-CM

## 2017-07-06 DIAGNOSIS — M1712 Unilateral primary osteoarthritis, left knee: Secondary | ICD-10-CM

## 2017-07-06 DIAGNOSIS — G43009 Migraine without aura, not intractable, without status migrainosus: Secondary | ICD-10-CM

## 2017-07-06 DIAGNOSIS — Z13220 Encounter for screening for lipoid disorders: Secondary | ICD-10-CM

## 2017-07-06 DIAGNOSIS — Z114 Encounter for screening for human immunodeficiency virus [HIV]: Secondary | ICD-10-CM

## 2017-07-06 NOTE — Progress Notes (Signed)
Subjective:    Patient ID: Jeffery Anthony, male    DOB: [birth year], 66 y.o.   MRN: [id number]  HPI   Pt is a 66 yo male who presents to the clinic for CPE.   He is walking daily. He does have left knee pain for years. Needs knee replacement. Done injections in the past.   In the last month his left anterior foot has started to hurt. He continues to walk and play golf shich makes it worse. Not tried anything to make better.   Migraines-occurs about once a month for last 3 years. MR normal done this year. imitrex does help if takes in time. Last eye exam 2 weeks ago and normal.    .. Active Ambulatory Problems    Diagnosis Date Noted  . Primary osteoarthritis of both knees 07/10/2013  . Colon polyp 10/04/2013  . Migraine 12/01/2016  . 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 No Resolved Ambulatory Problems   Past Medical History:  Diagnosis Date  . Arthritis    .Marland Kitchen Family History  Problem Relation Age of Onset  . Hyperlipidemia Father   . Hypertension Father   . Heart attack Father   . Colon cancer Neg Hx        Review of Systems  All other systems reviewed and are negative.      Objective:   Physical Exam BP 122/83   Pulse 72   Ht 5\' 11"  (1.803 m)   Wt 232 lb (105.2 kg)   BMI 32.36 kg/m   General Appearance:    Alert, cooperative, no distress, appears stated age  Head:    Normocephalic, without obvious abnormality, atraumatic  Eyes:    PERRL, conjunctiva/corneas clear, EOM's intact, fundi    benign, both eyes       Ears:    Normal TM's and external ear canals, both ears  Nose:   Nares normal, septum midline, mucosa normal, no drainage    or sinus tenderness  Throat:   Lips, mucosa, and tongue normal; teeth and gums normal  Neck:   Supple, symmetrical, trachea midline, no adenopathy;       thyroid:  No enlargement/tenderness/nodules; no carotid   bruit or JVD  Back:      Symmetric, no curvature, ROM normal, no CVA tenderness  Lungs:     Clear to auscultation bilaterally, respirations unlabored  Chest wall:    No tenderness or deformity  Heart:    Regular rate and rhythm, S1 and S2 normal, no murmur, rub   or gallop  Abdomen:     Soft, non-tender, bowel sounds active all four quadrants,    no masses, no organomegaly        Extremities:   Extremities normal, atraumatic, no cyanosis or edema, NROM of left foot/strength 5/5/tenderness over anterior foot to palpation just below 3rd metatarsal.   Pulses:   2+ and symmetric all extremities  Skin:   Skin color, texture, turgor normal, no rashes or lesions  Lymph nodes:   Cervical, supraclavicular, and axillary nodes normal  Neurologic:   CNII-XII intact. Normal strength, sensation and reflexes      throughout         Assessment & Plan:  Marland KitchenMarland KitchenRush Anthony was seen today for annual exam.  Diagnoses and all orders for this visit:  Routine physical examination -     Hepatitis C antibody -     HIV antibody (with reflex) -  Lipid Panel w/reflex Direct LDL -     COMPLETE METABOLIC PANEL WITH GFR -     PSA, total and free -     CBC  Need for hepatitis C screening test -     Hepatitis C antibody  Encounter for screening for HIV -     HIV antibody (with reflex)  Screening for lipid disorders -     Lipid Panel w/reflex Direct LDL  Screening for diabetes mellitus -     COMPLETE METABOLIC PANEL WITH GFR  Left foot pain  Primary osteoarthritis of left knee  Migraine without aura and without status migrainosus, not intractable  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 Your vaccines are up to date.  Encouraged to  start ASA daily.   Up to date colonoscopy. AUA 4 low risk.   Left foot pain: suspect could be a stress fracture. Declines xray. Declines post op shoe. Consider good supportive shoe. Follow up as needed.   Left OA of knee- consider sports medicine follow up for orthovisc.   Migraines: MR brain normal. Has as needed rescue that does work. Use as needed. If headaches occurring more than 2 times a week could consider preventative.   Pt declines all vaccines.

## 2017-07-06 NOTE — Patient Instructions (Addendum)
Keeping you healthy  Get these tests  Blood pressure- Have your blood pressure checked once a year by your healthcare provider.  Normal blood pressure is 120/80.  Weight- Have your body mass index (BMI) calculated to screen for obesity.  BMI is a measure of body fat based on height and weight. You can also calculate your own BMI at GravelBags.it.  Cholesterol- Have your cholesterol checked regularly starting at age 65, sooner may be necessary if you have diabetes, high blood pressure, if a family member developed heart diseases at an early age or if you smoke.   Chlamydia, HIV, and other sexual transmitted disease- Get screened each year until the age of 33 then within three months of each new sexual partner.  Diabetes- Have your blood sugar checked regularly if you have high blood pressure, high cholesterol, a family history of diabetes or if you are overweight.  Get these vaccines  Flu shot- Every fall.  Tetanus shot- Every 10 years.  Menactra- Single dose; prevents meningitis.  Take these steps  Don't smoke- If you do smoke, ask your healthcare provider about quitting. For tips on how to quit, go to www.smokefree.gov or call 1-800-QUIT-NOW.  Be physically active- Exercise 5 days a week for at least 30 minutes.  If you are not already physically active start slow and gradually work up to 30 minutes of moderate physical activity.  Examples of moderate activity include walking briskly, mowing the yard, dancing, swimming bicycling, etc.  Eat a healthy diet- Eat a variety of healthy foods such as fruits, vegetables, low fat milk, low fat cheese, yogurt, lean meats, poultry, fish, beans, tofu, etc.  For more information on healthy eating, go to www.thenutritionsource.org  Drink alcohol in moderation- Limit alcohol intake two drinks or less a day.  Never drink and drive.  Dentist- Brush and floss teeth twice daily; visit your dentis twice a year.  Depression-Your emotional  health is as important as your physical health.  If you're feeling down, losing interest in things you normally enjoy please talk with your healthcare provider.  Gun Safety- If you keep a gun in your home, keep it unloaded and with the safety lock on.  Bullets should be stored separately.  Helmet use- Always wear a helmet when riding a motorcycle, bicycle, rollerblading or skateboarding.  Safe sex- If you may be exposed to a sexually transmitted infection, use a condom  Seat belts- Seat bels can save your life; always wear one.  Smoke/Carbon Monoxide detectors- These detectors need to be installed on the appropriate level of your home.  Replace batteries at least once a year.  Skin Cancer- When out in the sun, cover up and use sunscreen SPF 15 or higher.  Violence- If anyone is threatening or hurting you, please tell your healthcare provider.   Benign Prostatic Hyperplasia Benign prostatic hyperplasia is when the prostate gland is bigger than normal (enlarged). The prostate is a gland that produces the fluid that goes into semen. It is near the opening to the bladder and it surrounds the tube that drains urine out of the body (urethra). Benign prostatic hyperplasia is common among older men and it typically causes problems with urinating. The prostate grows slowly as you age. As the prostate grows, it can pinch the urethra. This causes the bladder to work too hard to pass urine, which leads to a thickened bladder wall. The bladder may eventually become weak and unable to empty completely. What are the causes? The exact cause of  this condition is not known. It may be related to changes in hormones as the body ages. What increases the risk? You are more likely to develop this condition if:  You have a family history of the condition.  You are age 34 or older.  You have a history of erectile dysfunction.  You do not exercise.  You have certain medical conditions, including: ? Type 2  diabetes. ? Obesity. ? Heart and circulatory disease.  What are the signs or symptoms? Symptoms of this condition include:  Weak or interrupted urine stream.  Dribbling or leaking urine.  Feeling like the bladder has not emptied completely.  Difficulty starting urination.  Getting up frequently at night to urinate.  Urinating more often (8 or more times a day).  Accidental loss of urine (urinary incontinence).  Pain during urination or ejaculation.  Urine with an unusual smell or color.  The size of the prostate does not always determine the severity of the symptoms. For example, a man with a large prostate may experience minor symptoms, or a man with a smaller prostate may experience a severe blockage. How is this diagnosed? This condition may be diagnosed based on:  Your medical history and symptoms.  A physical exam. This usually includes a digital rectal exam. During this exam, your health care provider places a gloved, lubricated finger into the rectum to feel the size of the prostate.  A blood test. This test checks for high levels of a protein that is produced by the prostate (prostate specific antigen, PSA).  Tests to examine how well the urethra and bladder are functioning (urodynamic tests).  Cystoscopy. For this test, a small, tube-shaped instrument (cystoscope) is used to look inside the urethra and bladder. The cystoscope is placed into the urinary tract through the opening at the tip of the penis.  Urine tests.  Ultrasound.  How is this treated? Treatment for this condition depends on how severe your symptoms are. Treatment may include:  Active surveillance or "watchful waiting." If your symptoms are mild, your health care provider may delay treatment and ask you to keep track of your symptoms. You will have regular checkups to examine the size of your prostate, discuss symptoms, and determine whether treatment is needed.  Medicines. These may be used  to: ? Stop prostate growth. ? Shrink the prostate. ? Relieve symptoms.  Lifestyle changes, including: ? Pelvic floor muscle exercises. The pelvic floor muscles are a group of muscles that relax when you urinate. ? Bladder training. This involves exercises that train the bladder to hold more urine for longer periods. ? Reducing the amount of liquid that you drink. This is especially important before sleeping and before long periods of time spent in public. ? Reducing the amount of caffeine and alcohol that you drink. ? Treating or preventing constipation.  Surgery to reduce the size of the prostate or widen the urethra. This is typically done if your symptoms are severe or there are serious complications from the enlarged prostate.  Follow these instructions at home: Medicines  Take over-the-counter and prescription medicines as told by your health care provider.  Avoid certain medicines, such as decongestants, antihistamines, and some prescription medicines as told by your health care provider. Ask your health care provider which medicines you should avoid. General instructions  Monitor your symptoms for any changes. Tell your health care provider about any changes.  Give yourself time when you urinate.  Avoid certain beverages that can irritate the bladder, such as: ?  Alcohol. ? Caffeinated drinks like coffee, tea, and cola.  Avoid drinking large amounts of liquid before bed or before going out in public.  Do pelvic floor muscle or bladder training exercises as told by your health care provider.  Keep all follow-up visits as told by your health care provider. This is important. Contact a health care provider if:  Your develop new or worse symptoms.  You have trouble getting or maintaining an erection.  You have a fever.  You have pain or burning during urination.  You have blood in your urine. Get help right away if:  You have severe pain when urinating.  You cannot  urinate.  You have severe pain in your abdomen.  You are dizzy.  You faint.  You have severe back pain.  Your urine is dark red and difficult to see through.  You have large blood clots in your urine.  You have severe pain after an erection.  You have chest pain, dizziness, or nausea during sexual activity. Summary  The prostate is a gland that produces the fluid that goes into semen. It is near the opening to the bladder and it surrounds the tube that drains urine out of the body (urethra).  Benign prostatic hyperplasia is common among older men and it typically causes problems with urinating.  If your symptoms are mild, your health care provider may delay treatment and ask you to keep track of your symptoms. You will have regular checkups to examine the size of your prostate, discuss symptoms, and determine whether treatment is needed.  If directed, you may need to avoid certain medicines, such as decongestants, antihistamines, and some prescription medicines.  Contact your health care provider if you develop new or worse symptoms. This information is not intended to replace advice given to you by your health care provider. Make sure you discuss any questions you have with your health care provider. Document Released: 10/12/2005 Document Revised: 08/31/2016 Document Reviewed: 08/31/2016 Elsevier Interactive Patient Education  2017 [employer].

## 2017-07-07 DIAGNOSIS — G610 Guillain-Barre syndrome: Secondary | ICD-10-CM | POA: Insufficient documentation

## 2017-07-07 DIAGNOSIS — T881XXA Other complications following immunization, not elsewhere classified, initial encounter: Secondary | ICD-10-CM | POA: Insufficient documentation

## 2017-07-07 NOTE — Progress Notes (Signed)
Call pt: hep C is negative.  HIV is negative.  PSA normal.  Glucose just a tad elevated. Add a1c.  Cholesterol looks good.

## 2017-07-09 LAB — COMPLETE METABOLIC PANEL WITH GFR
AG Ratio: 2 (calc) (ref 1.0–2.5)
ALT: 22 U/L (ref 9–46)
AST: 22 U/L (ref 10–35)
Albumin: 4.5 g/dL (ref 3.6–5.1)
Alkaline phosphatase (APISO): 46 U/L (ref 40–115)
BUN: 17 mg/dL (ref 7–25)
CO2: 27 mmol/L (ref 20–32)
Calcium: 9.9 mg/dL (ref 8.6–10.3)
Chloride: 107 mmol/L (ref 98–110)
Creat: 0.92 mg/dL (ref 0.70–1.25)
GFR, Est African American: 102 mL/min/{1.73_m2} (ref >=60)
GFR, Est Non African American: 88 mL/min/{1.73_m2} (ref >=60)
Globulin: 2.3 g/dL (calc) (ref 1.9–3.7)
Glucose, Bld: 100 mg/dL — ABNORMAL HIGH (ref 65–99)
Potassium: 4.4 mmol/L (ref 3.5–5.3)
Sodium: 142 mmol/L (ref 135–146)
Total Bilirubin: 0.7 mg/dL (ref 0.2–1.2)
Total Protein: 6.8 g/dL (ref 6.1–8.1)

## 2017-07-09 LAB — CBC
HCT: 43.1 % (ref 38.5–50.0)
Hemoglobin: 15.2 g/dL (ref 13.2–17.1)
MCH: 37.5 pg — ABNORMAL HIGH (ref 27.0–33.0)
MCHC: 35.3 g/dL (ref 32.0–36.0)
MCV: 106.4 fL — ABNORMAL HIGH (ref 80.0–100.0)
MPV: 10.7 fL (ref 7.5–12.5)
Platelets: 230 10*3/uL (ref 140–400)
RBC: 4.05 10*6/uL — ABNORMAL LOW (ref 4.20–5.80)
RDW: 14.7 % (ref 11.0–15.0)
WBC: 6 10*3/uL (ref 3.8–10.8)

## 2017-07-09 LAB — LIPID PANEL W/REFLEX DIRECT LDL
Cholesterol: 183 mg/dL (ref <200)
HDL: 42 mg/dL (ref >40)
LDL Cholesterol (Calc): 118 mg/dL (calc) — ABNORMAL HIGH
Non-HDL Cholesterol (Calc): 141 mg/dL (calc) — ABNORMAL HIGH (ref <130)
Total CHOL/HDL Ratio: 4.4 (calc) (ref <5.0)
Triglycerides: 121 mg/dL (ref <150)

## 2017-07-09 LAB — TEST AUTHORIZATION

## 2017-07-09 LAB — PSA, TOTAL AND FREE
PSA, % Free: 28 % (calc) (ref >25)
PSA, Free: 0.8 ng/mL
PSA, Total: 2.9 ng/mL (ref <=4.0)

## 2017-07-09 LAB — HEMOGLOBIN A1C W/OUT EAG: Hgb A1c MFr Bld: 5.6 % of total Hgb (ref <5.7)

## 2017-07-09 LAB — HIV ANTIBODY (ROUTINE TESTING W REFLEX): HIV 1&2 Ab, 4th Generation: NONREACTIVE

## 2017-07-09 LAB — HEPATITIS C ANTIBODY
Hepatitis C Ab: NONREACTIVE
SIGNAL TO CUT-OFF: 0.18 (ref <1.00)

## 2017-07-19 ENCOUNTER — Other Ambulatory Visit: Payer: Self-pay | Admitting: Physician Assistant

## 2017-07-19 DIAGNOSIS — R519 Headache, unspecified: Secondary | ICD-10-CM

## 2017-07-19 DIAGNOSIS — R51 Headache: Principal | ICD-10-CM

## 2017-11-17 ENCOUNTER — Encounter: Payer: Self-pay | Admitting: Physician Assistant

## 2017-11-17 ENCOUNTER — Ambulatory Visit (INDEPENDENT_AMBULATORY_CARE_PROVIDER_SITE_OTHER): Payer: Commercial Managed Care - PPO | Admitting: Physician Assistant

## 2017-11-17 VITALS — BP 147/76 | HR 62 | Ht 71.0 in | Wt 232.0 lb

## 2017-11-17 DIAGNOSIS — J069 Acute upper respiratory infection, unspecified: Secondary | ICD-10-CM

## 2017-11-17 DIAGNOSIS — H6981 Other specified disorders of Eustachian tube, right ear: Secondary | ICD-10-CM

## 2017-11-17 DIAGNOSIS — H8111 Benign paroxysmal vertigo, right ear: Secondary | ICD-10-CM

## 2017-11-17 MED ORDER — ALBUTEROL SULFATE (2.5 MG/3ML) 0.083% IN NEBU
2.5000 mg | INHALATION_SOLUTION | Freq: Four times a day (QID) | RESPIRATORY_TRACT | 1 refills | Status: DC | PRN
Start: 2017-11-17 — End: 2017-11-17

## 2017-11-17 MED ORDER — MECLIZINE HCL 25 MG PO TABS: 25.0000 mg | ORAL_TABLET | Freq: Three times a day (TID) | ORAL | 1 refills | Status: DC | PRN

## 2017-11-17 MED ORDER — METHYLPREDNISOLONE SODIUM SUCC 125 MG IJ SOLR
125.0000 mg | Freq: Once | INTRAMUSCULAR | Status: AC
  Administered 2017-11-17: 125 mg via INTRAMUSCULAR

## 2017-11-17 NOTE — Progress Notes (Addendum)
Subjective:    Patient ID: Jeffery Anthony, male    DOB: [birth year], 67 y.o.   MRN: [id number]  HPI  Patient is a 67 year old male presenting with acute onset dizziness, loss of balance, and sensation of being light headed. He has had sinus congestion, cough, and sore throat that began last 11/11/17. He reports these symptoms began to feel better today but then his dizziness began. He got up out of bed to go to the shower and began feeling dizzy and unbalanced on his way there. Once in the shower he describe his feeling as being lightheaded, dizzy, and unbalanced to the point he needed the wall or other objects to assist with ambulation. He has no symptoms while seated and still. He noticed that when he does walk he tends to fall towards the right side. He describes the unbalance feeling like walking in a bouncy house and does not feel like the room is spinning around. He denies vision changes. He has stayed hydrated and eating. He denies a history of CAD or chest pain. He denies any weakness. His mother also has a history of BPPV that required vestibular rehabilitation.   .. Active Ambulatory Problems    Diagnosis Date Noted  . Primary osteoarthritis of both knees 07/10/2013  . Colon polyp 10/04/2013  . Migraine 12/01/2016  . 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 No Resolved Ambulatory Problems   Past Medical History:  Diagnosis Date  . Arthritis       Review of Systems  Constitutional: Negative for chills, fatigue and fever.  HENT: Positive for congestion, postnasal drip, rhinorrhea and sinus pressure. Negative for sinus pain and sore throat.   Respiratory: Positive for cough.   Cardiovascular: Negative for chest pain and palpitations.  Gastrointestinal: Negative for abdominal pain.  Neurological: Positive for dizziness, light-headedness and headaches. Negative for weakness.  Psychiatric/Behavioral:  Negative for confusion.       Objective:   Physical Exam  Constitutional: He is oriented to person, place, and time. He appears well-developed and well-nourished. No distress.  HENT:  Head: Normocephalic and atraumatic.  Right Ear: External ear normal.  Left Ear: External ear normal.  Positive Dix-Hallpike to the right side.   Eyes: Pupils are equal, round, and reactive to light.  Neck: Neck supple.  Cardiovascular: Normal rate and regular rhythm. Exam reveals no gallop and no friction rub.  No murmur heard. Pulmonary/Chest: Effort normal and breath sounds normal.  Musculoskeletal:  Strength testing in the upper and lower extremities 5/5.   Lymphadenopathy:    He has no cervical adenopathy.  Neurological: He is alert and oriented to person, place, and time. He has normal strength.  Psychiatric: He has a normal mood and affect. His behavior is normal.   Vitals:   11/17/17 1000  BP: (!) 147/76  Pulse: 62      Assessment & Plan:   Jeffery Anthony was seen today for dizziness.  Diagnoses and all orders for this visit:  Benign paroxysmal positional vertigo of right ear -     meclizine (ANTIVERT) 25 MG tablet; Take 1 tablet (25 mg total) by mouth 3 (three) times daily as needed for dizziness or nausea.  ETD (Eustachian tube dysfunction), right -     methylPREDNISolone sodium succinate (SOLU-MEDROL) 125 mg/2 mL injection 125 mg  Viral URI -     Discontinue: albuterol (PROVENTIL) (2.5 MG/3ML) 0.083% nebulizer solution; Take 3 mLs (2.5  mg total) by nebulization every 6 (six) hours as needed for wheezing or shortness of breath. -     methylPREDNISolone sodium succinate (SOLU-MEDROL) 125 mg/2 mL injection 125 mg   Jeffery Anthony was given a handout on BPPV. I see no neurological findings suggestive of cerebellum stroke.  He has been given a handout on how to perform the Epley maneuver at home to try to relieve his symptoms. He also has been given Antivert to addess his extreme dizziness. He should not  return to work until his symptoms have resolved and it is safe to drive without symptoms. He has also been given a Solu-medrol injection to help alleviate inflammation in the upper respiratory tract and hopefully help with some suspected eustachian tube dysfunction. If he does not get better in 3 days we will consider bringing him back for a PT referral.  Symptomatic care for URI given.

## 2017-11-17 NOTE — Patient Instructions (Signed)

## 2017-11-22 ENCOUNTER — Encounter: Payer: Self-pay | Admitting: Emergency Medicine

## 2018-02-15 ENCOUNTER — Telehealth: Payer: Self-pay | Admitting: Sports Medicine

## 2018-02-15 ENCOUNTER — Ambulatory Visit: Payer: Commercial Managed Care - PPO | Admitting: Sports Medicine

## 2018-02-15 DIAGNOSIS — M170 Bilateral primary osteoarthritis of knee: Secondary | ICD-10-CM | POA: Diagnosis not present

## 2018-02-15 NOTE — Telephone Encounter (Signed)
-----   Message from Tasia Catchings, [place] sent at 02/15/2018  4:16 PM EDT ----- Information has been submitted to [facility] and awaiting determination.   ----- Message ----- From: Silverio Decamp, MD Sent: 02/15/2018   2:52 PM To: Beatris Ship Shameer [place]stad, CMA  Left knee Orthov[facility] approval, x-ray confirmed, failed steroid injections ___________________________________________ Gwen Her. Dianah Field, M.D., ABFM., CAQSM. Primary Care and [place] Instructor of [place] [facility]

## 2018-02-15 NOTE — Assessment & Plan Note (Signed)
Previous injection was about 2 years ago, having recurrence of pain, he is interested in doing Visco supplementation. He is not yet interested in oral NSAIDs. Steroid injection today, approval for Visco supplementation, rehab exercises given. Return to see me when Orthovisc is approved to start series of injections.

## 2018-02-15 NOTE — Progress Notes (Signed)
Subjective:    CC: Left knee pain  HPI: This is a pleasant 67 year old male with known left knee osteoarthritis, we last treated him about 2 years ago with an injection, he did well for about a month and a half but then had a recurrence of pain, never presented.  He feels as though he can live with it.  No mechanical symptoms, pain is mostly at the medial joint line, what brought him in today is more of a new swelling in the back of the knee.  He has significant gelling, and declines oral NSAIDs.  I reviewed the past medical history, family history, social history, surgical history, and allergies today and no changes were needed.  Please see the problem list section below in epic for further details.  Past Medical History: Past Medical History:  Diagnosis Date  . Arthritis    knees   Past Surgical History: Past Surgical History:  Procedure Laterality Date  . KNEE ARTHROSCOPY Bilateral 1992, 2008  . ruptured achilles tendon Left 1992  . TONSILLECTOMY AND ADENOIDECTOMY  1961   Social History: Social History   Socioeconomic History  . Marital status: Married    Spouse name: Not on file  . Number of children: Not on file  . Years of education: Not on file  . Highest education level: Not on file  Occupational History  . Not on file  Social Needs  . Financial resource strain: Not on file  . Food insecurity:    Worry: Not on file    Inability: Not on file  . Transportation needs:    Medical: Not on file    Non-medical: Not on file  Tobacco Use  . Smoking status: Never Smoker  . Smokeless tobacco: Never Used  Substance and Sexual Activity  . Alcohol use: Yes    Alcohol/week: 0.6 oz    Types: 1 Glasses of wine per week  . Drug use: No  . Sexual activity: Yes  Lifestyle  . Physical activity:    Days per week: Not on file    Minutes per session: Not on file  . Stress: Not on file  Relationships  . Social connections:    Talks on phone: Not on file    Gets together:  Not on file    Attends religious service: Not on file    Active member of club or organization: Not on file    Attends meetings of clubs or organizations: Not on file    Relationship status: Not on file  Other Topics Concern  . Not on file  Social History Narrative  . Not on file   Family History: Family History  Problem Relation Age of Onset  . Hyperlipidemia Father   . Hypertension Father   . Heart attack Father   . Colon cancer Neg Hx    Allergies: Allergies  Allergen Reactions  . Ivp Dye [Iodinated Diagnostic Agents] Nausea And Vomiting    Comment: iodine dye   Medications: See med rec.  Review of Systems: No fevers, chills, night sweats, weight loss, chest pain, or shortness of breath.   Objective:    General: Well Developed, well nourished, and in no acute distress.  Neuro: Alert and oriented x3, extra-ocular muscles intact, sensation grossly intact.  HEENT: Normocephalic, atraumatic, pupils equal round reactive to light, neck supple, no masses, no lymphadenopathy, thyroid nonpalpable.  Skin: Warm and dry, no rashes. Cardiac: Regular rate and rhythm, no murmurs rubs or gallops, no lower extremity edema.  Respiratory: Clear  to auscultation bilaterally. Not using accessory muscles, speaking in full sentences. Left knee: Minimally swollen, mild fluid wave, tender to palpation at the medial joint line There is some fullness in the back of the knee but no overt Baker's cyst. ROM normal in flexion and extension and lower leg rotation. Ligaments with solid consistent endpoints including ACL, PCL, LCL, MCL. Negative Mcmurray's and provocative meniscal tests. Non painful patellar compression.  The some fullness Patellar and quadriceps tendons unremarkable. Hamstring and quadriceps strength is normal.  Procedure: Real-time Ultrasound Guided Injection of left knee Device: GE Logiq E  Verbal informed consent obtained.  Time-out conducted.  Noted no overlying erythema,  induration, or other signs of local infection.  Skin prepped in a sterile fashion.  Local anesthesia: Topical Ethyl chloride.  With sterile technique and under real time ultrasound guidance: 1 cc kenalog 40, 2 cc lidocaine, 2 cc bupivacaine injected easily Completed without difficulty  Pain immediately resolved suggesting accurate placement of the medication.  Advised to call if fevers/chills, erythema, induration, drainage, or persistent bleeding.  Images permanently stored and available for review in the ultrasound unit.  Impression: Technically successful ultrasound guided injection.  The knee was then strapped with a compressive dressing  Impression and Recommendations:    Primary osteoarthritis of both knees Previous injection was about 2 years ago, having recurrence of pain, he is interested in doing Visco supplementation. He is not yet interested in oral NSAIDs. Steroid injection today, approval for Visco supplementation, rehab exercises given. Return to see me when Orthovisc is approved to start series of injections.  ___________________________________________ Gwen Her. Dianah Field, M.D., ABFM., CAQSM. Primary Care and [place] Instructor of [place] of [facility]

## 2018-02-17 NOTE — Telephone Encounter (Signed)
Orthovisc is covered. All services associated with the office visit, injection and product are covered under one copay of $40. The deductible does not apply. Once the out of pocket is met, the patient will have no financial responsibility. Call reference number is [id number].  Called patient and he will call back with questions.

## 2018-03-17 NOTE — Telephone Encounter (Signed)
Pt's wife called, she would like to be contacted regarding OV injections. Pt is having increased knee pain. Routing.

## 2018-03-17 NOTE — Telephone Encounter (Signed)
Spoke with patients wife and the patient has been scheduled for his Left knee injection for 03/18/2018.

## 2018-03-18 ENCOUNTER — Ambulatory Visit (INDEPENDENT_AMBULATORY_CARE_PROVIDER_SITE_OTHER): Payer: Commercial Managed Care - PPO | Admitting: Sports Medicine

## 2018-03-18 ENCOUNTER — Encounter: Payer: Self-pay | Admitting: Sports Medicine

## 2018-03-18 DIAGNOSIS — M170 Bilateral primary osteoarthritis of knee: Secondary | ICD-10-CM | POA: Diagnosis not present

## 2018-03-18 NOTE — Progress Notes (Signed)

## 2018-03-18 NOTE — Assessment & Plan Note (Addendum)
Orthovisc number 1 of 4 left knee, return in 1 week for number 2 of 4. He will let me know if he like to try Celebrex at the next visit.

## 2018-03-24 ENCOUNTER — Encounter: Payer: Self-pay | Admitting: Sports Medicine

## 2018-03-24 ENCOUNTER — Ambulatory Visit: Payer: Commercial Managed Care - PPO | Admitting: Sports Medicine

## 2018-03-24 DIAGNOSIS — M170 Bilateral primary osteoarthritis of knee: Secondary | ICD-10-CM | POA: Diagnosis not present

## 2018-03-24 MED ORDER — CELECOXIB 200 MG PO CAPS
ORAL_CAPSULE | ORAL | 2 refills | Status: DC
Start: 2018-03-24 — End: 2018-12-28

## 2018-03-24 NOTE — Assessment & Plan Note (Addendum)
Orthovisc No. 2 of 4 into the left knee, return in 1 week for #3 of 4. We are going to try Celebrex.

## 2018-03-24 NOTE — Progress Notes (Signed)

## 2018-03-30 ENCOUNTER — Encounter: Payer: Self-pay | Admitting: Sports Medicine

## 2018-03-30 ENCOUNTER — Ambulatory Visit: Payer: Commercial Managed Care - PPO | Admitting: Sports Medicine

## 2018-03-30 DIAGNOSIS — M170 Bilateral primary osteoarthritis of knee: Secondary | ICD-10-CM

## 2018-03-30 NOTE — Progress Notes (Signed)

## 2018-03-30 NOTE — Assessment & Plan Note (Signed)
Orthovisc No. 3 of 4 into the left knee, return in 1 week for #404. He did try Celebrex, only once, and this relieved all of his pain.

## 2018-04-05 ENCOUNTER — Encounter: Payer: Self-pay | Admitting: Physician Assistant

## 2018-04-05 ENCOUNTER — Ambulatory Visit (INDEPENDENT_AMBULATORY_CARE_PROVIDER_SITE_OTHER): Payer: Commercial Managed Care - PPO | Admitting: Physician Assistant

## 2018-04-05 VITALS — BP 113/81 | HR 66 | Ht 71.0 in | Wt 238.0 lb

## 2018-04-05 DIAGNOSIS — R300 Dysuria: Secondary | ICD-10-CM

## 2018-04-05 DIAGNOSIS — R310 Gross hematuria: Secondary | ICD-10-CM

## 2018-04-05 LAB — COMPLETE METABOLIC PANEL WITH GFR
AG Ratio: 2.1 (calc) (ref 1.0–2.5)
ALT: 15 U/L (ref 9–46)
AST: 15 U/L (ref 10–35)
Albumin: 4.4 g/dL (ref 3.6–5.1)
Alkaline phosphatase (APISO): 50 U/L (ref 40–115)
BUN: 18 mg/dL (ref 7–25)
CO2: 27 mmol/L (ref 20–32)
Calcium: 9.7 mg/dL (ref 8.6–10.3)
Chloride: 102 mmol/L (ref 98–110)
Creat: 1 mg/dL (ref 0.70–1.25)
GFR, Est African American: 91 mL/min/{1.73_m2} (ref >=60)
GFR, Est Non African American: 79 mL/min/{1.73_m2} (ref >=60)
Globulin: 2.1 g/dL (calc) (ref 1.9–3.7)
Glucose, Bld: 88 mg/dL (ref 65–139)
Potassium: 4 mmol/L (ref 3.5–5.3)
Sodium: 140 mmol/L (ref 135–146)
Total Bilirubin: 0.9 mg/dL (ref 0.2–1.2)
Total Protein: 6.5 g/dL (ref 6.1–8.1)

## 2018-04-05 LAB — CBC WITH DIFFERENTIAL/PLATELET
Basophils Absolute: 48 cells/uL (ref 0–200)
Basophils Relative: 0.7 %
Eosinophils Absolute: 238 cells/uL (ref 15–500)
Eosinophils Relative: 3.5 %
HCT: 41.7 % (ref 38.5–50.0)
Hemoglobin: 14.9 g/dL (ref 13.2–17.1)
Lymphs Abs: 2319 cells/uL (ref 850–3900)
MCH: 39.7 pg — ABNORMAL HIGH (ref 27.0–33.0)
MCHC: 35.7 g/dL (ref 32.0–36.0)
MCV: 111.2 fL — ABNORMAL HIGH (ref 80.0–100.0)
MPV: 10.6 fL (ref 7.5–12.5)
Monocytes Relative: 10.4 %
Neutro Abs: 3488 cells/uL (ref 1500–7800)
Neutrophils Relative %: 51.3 %
Platelets: 230 10*3/uL (ref 140–400)
RBC: 3.75 10*6/uL — ABNORMAL LOW (ref 4.20–5.80)
RDW: 15.4 % — ABNORMAL HIGH (ref 11.0–15.0)
Total Lymphocyte: 34.1 %
WBC mixed population: 707 cells/uL (ref 200–950)
WBC: 6.8 10*3/uL (ref 3.8–10.8)

## 2018-04-05 LAB — POCT URINALYSIS DIPSTICK
Bilirubin, UA: NEGATIVE
Glucose, UA: NEGATIVE
Ketones, UA: 15
Nitrite, UA: NEGATIVE
Protein, UA: NEGATIVE
Spec Grav, UA: 1.02 (ref 1.010–1.025)
Urobilinogen, UA: 0.2 E.U./dL
pH, UA: 6 (ref 5.0–8.0)

## 2018-04-05 MED ORDER — CIPROFLOXACIN HCL 500 MG PO TABS: 500.0000 mg | ORAL_TABLET | Freq: Two times a day (BID) | ORAL | 0 refills | Status: DC

## 2018-04-05 NOTE — Patient Instructions (Signed)

## 2018-04-05 NOTE — Progress Notes (Signed)
Subjective:    Patient ID: Jeffery Anthony, male    DOB: August 10, 1951, 67 y.o.   MRN: [id number]  HPI  Patient is a 67 yo male with medical history of colon polyps and OA presenting to the clinic for acute onset urethral bleeding. He reports he had some urethral burning during urination yesterday and this morning with his first void he had gross bleeding at the end of urination. He has urinated 4-5 times today which is slightly more often than normal for him. Some pink discoloration of the urine at the beginning of his stream and some burning with each time but no more episodes of frank blood. He has noticed some spotting of blood on his underwear. He has had some issues with hesitancy in the past. He denies any color changes otherwise. He reports some back pain on Sunday that resolved with pain medication for his OA. He denies fever, chills, or recent URI. He denies a family history of bladder or kidney issues. His father had some prostate issues but he states it was not cancerous. He has never smoked cigarettes.    Review of Systems  All other systems reviewed and are negative.      Objective:   Physical Exam  Constitutional: He is oriented to person, place, and time. He appears well-developed and well-nourished. No distress.  HENT:  Head: Normocephalic and atraumatic.  Cardiovascular: Normal rate, regular rhythm and normal heart sounds.  Pulmonary/Chest: Effort normal and breath sounds normal.  Abdominal: Soft. Bowel sounds are normal. He exhibits no distension and no mass. There is no tenderness. There is no guarding and no CVA tenderness.  Genitourinary: Rectum normal and prostate normal.  Genitourinary Comments: No pain with DRE that would be suggestive of prostatitis.   Neurological: He is alert and oriented to person, place, and time.  Skin: Skin is dry. He is not diaphoretic.  Psychiatric: He has a normal mood and affect. His behavior is normal.   .. Active Ambulatory Problems    Diagnosis Date Noted  . Primary osteoarthritis of both knees 07/10/2013  . Colon polyp 10/04/2013  . Migraine 12/01/2016  . 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 Benign paroxysmal positional vertigo of right ear 11/17/2017   Resolved Ambulatory Problems    Diagnosis Date Noted  . No Resolved Ambulatory Problems   Past Medical History:  Diagnosis Date  . Arthritis   .Marland Kitchen Family History  Problem Relation Age of Onset  . Hyperlipidemia Father   . Hypertension Father   . Heart attack Father   . Colon cancer Neg Hx       Assessment & Plan:  Marland KitchenMarland KitchenDiagnoses and all orders for this visit:  Gross hematuria -     CBC w/Diff/Platelet -     COMPLETE METABOLIC PANEL WITH GFR -     ciprofloxacin (CIPRO) 500 MG tablet; Take 1 tablet (500 mg total) by mouth 2 (two) times daily. For 10 days. -     POCT urinalysis dipstick -     Urine Culture -     Urinalysis, microscopic only  Dysuria -     ciprofloxacin (CIPRO) 500 MG tablet; Take 1 tablet (500 mg total) by mouth 2 (two) times daily. For 10 days.  .. Results for orders placed or performed in visit on 04/05/18  POCT urinalysis dipstick  Result Value Ref Range   Color, UA yellow    Clarity, UA clear    Glucose, UA Negative Negative   Bilirubin, UA neg  Ketones, UA 15    Spec Grav, UA 1.020 1.010 - 1.025   Blood, UA moderate    pH, UA 6.0 5.0 - 8.0   Protein, UA Negative Negative   Urobilinogen, UA 0.2 0.2 or 1.0 E.U./dL   Nitrite, UA neg    Leukocytes, UA Trace (A) Negative   Appearance     Odor     Will culture and ordered microscopic.   Treating presumptively for UTI given dysuria and gross blood. Prostatitis unlikely given no symptoms on prostate exam. CMP for kidney function. Will consider further work-up for bladder cancer if symptoms do not resolve and pending UA results.followup with any new or worsening symptoms.

## 2018-04-06 LAB — URINE CULTURE
MICRO NUMBER:: 90700620
Result:: NO GROWTH
SPECIMEN QUALITY:: ADEQUATE

## 2018-04-06 LAB — URINALYSIS, MICROSCOPIC ONLY
Bacteria, UA: NONE SEEN /HPF
Hyaline Cast: NONE SEEN /LPF
Squamous Epithelial / LPF: NONE SEEN /HPF (ref <=5)

## 2018-04-06 NOTE — Progress Notes (Signed)
Call pt: hgb looks good. Kidney function looks great. Still awaiting culture.

## 2018-04-07 ENCOUNTER — Ambulatory Visit: Payer: Commercial Managed Care - PPO | Admitting: Sports Medicine

## 2018-04-07 ENCOUNTER — Ambulatory Visit (INDEPENDENT_AMBULATORY_CARE_PROVIDER_SITE_OTHER): Payer: Commercial Managed Care - PPO

## 2018-04-07 ENCOUNTER — Other Ambulatory Visit: Payer: Self-pay

## 2018-04-07 DIAGNOSIS — R310 Gross hematuria: Secondary | ICD-10-CM | POA: Diagnosis not present

## 2018-04-07 DIAGNOSIS — M549 Dorsalgia, unspecified: Secondary | ICD-10-CM | POA: Diagnosis not present

## 2018-04-07 DIAGNOSIS — M170 Bilateral primary osteoarthritis of knee: Secondary | ICD-10-CM | POA: Diagnosis not present

## 2018-04-07 NOTE — Progress Notes (Signed)
Call pt: urine culture did not show any growth of bacteria to cause the bleeding. I would like to do CT of pelvis to look anything that could be causing blood in urine. I have already placed order.

## 2018-04-07 NOTE — Addendum Note (Signed)
Addended by: Donella Stade on: 04/07/2018 08:58 AM   Modules accepted: Orders

## 2018-04-07 NOTE — Progress Notes (Signed)

## 2018-04-07 NOTE — Assessment & Plan Note (Signed)
Orthovisc No. 4 of 4 today into the left knee, Celebrex also relieved all of his pain. Return to see me as needed.

## 2018-04-08 NOTE — Progress Notes (Signed)
Call pt: CT is reassuring no masses or stones found. Has the blood stopped completely now? I still think urology appt and consult would be prudent? Are you ok with referral?

## 2018-04-12 NOTE — Progress Notes (Signed)
Awesome news about bleeding stopped. I did place referral.

## 2018-04-15 ENCOUNTER — Telehealth: Payer: Self-pay | Admitting: Physician Assistant

## 2018-04-15 NOTE — Telephone Encounter (Signed)
Will send referral to [facility]

## 2018-04-15 NOTE — Telephone Encounter (Signed)
Jeffery Anthony, This patient wife Jeffery Anthony calls and wants to know if the urology referral Jeffery Anthony put in can be set up at [facility] Dr. Marcelo Anthony. Same urologist she sees. KG LPN

## 2018-05-17 ENCOUNTER — Ambulatory Visit: Payer: Commercial Managed Care - PPO | Admitting: Sports Medicine

## 2018-05-17 ENCOUNTER — Encounter: Payer: Self-pay | Admitting: Sports Medicine

## 2018-05-17 DIAGNOSIS — M170 Bilateral primary osteoarthritis of knee: Secondary | ICD-10-CM | POA: Diagnosis not present

## 2018-05-17 NOTE — Patient Instructions (Signed)
Preparing for Knee Replacement Getting prepared before knee replacement surgery can make your recovery easier and more comfortable. This document provides some tips and guidelines that will help you prepare for your surgery. You can ease any concerns about your financial responsibilities by calling your insurance company after you decide to have surgery. In addition to asking about your surgery and hospital stay, you will want to ask about coverage for medical equipment, rehabilitation facilities, and home care. How should I arrange for help? You will be stronger and more mobile every day. However, in the first couple of weeks after surgery, it is unlikely that you will be able to do all your daily activities as easily as you did before your surgery. You may tire easily and will still have limited movement in your leg. Follow these guidelines to best arrange for the help you may need after your surgery:  Plan to have someone take you home after the procedure. Your health care provider will tell you how many days you can expect to be in the hospital.  Cancel all work, caregiving, and volunteer responsibilities for at least 4-6 weeks after your surgery.  If you live alone, arrange for someone to take care of your home and pets for the first 4-6 weeks after surgery.  Plan to have someone stay with you day and night for the first week. This person should be someone you are comfortable with. You may need this person to help you with your exercises and personal care, such as bathing and using the toilet.  Arrange for drivers to take you to and from your follow-up appointments, the grocery store, and other places you may need to go for at least 4-6 weeks.  How should I prepare my home?  Pick a recovery spot, but do not plan on recovering in bed. Sitting upright is better for your health. You may want to use a recliner with a small table nearby. Place the items you use most frequently on that table. These  may include the TV remote, a cordless phone, a cell phone, a book or laptop computer, a water glass, and any other items of your choice.  Remove all clutter from your floors. Also remove any throw rugs.  To see if you will be able to move in your home with a walker, hold your hands out about 6 inches (15 cm) from your sides. Walk from your recovery spot to your kitchen and bathroom. Then walk from your bed to the bathroom. If you do not hit anything with your hands, you'll know that you have enough room for a walker.  Move the items that you use most often from your kitchen, bathroom, and bedroom to shelves and drawers that are at countertop height.  Prepare a few meals to freeze and reheat later.  Consider adding grab bars in the shower and near the toilet.  While you are in the hospital, you will learn about equipment that can be helpful for your recovery. Equipment may include raised toilet seats, tub benches, and shower benches. How should I prepare my body?  Have a preoperative exam. ? During the exam, your health care provider will make sure that your body is healthy enough to safely have this surgery. ? To your exam, take a complete list of all your medicines and supplements, including herbs and vitamins. ? You may need to have additional tests to ensure your safety.  Have elective dental care and routine cleanings completed before your surgery. Germs   from anywhere in your body, including your mouth, can travel to your new joint and infect it. It is important that you do not have any dental work performed for at least 3 months after your surgery. After surgery, be sure to tell your dentist about your joint replacement.  Maintain a healthy diet. Do not change your diet before surgery unless your health care provider advised you to do that.  Do not use any products that contain nicotine or tobacco, such as cigarettes or e-cigarettes. Tobacco and nicotine can delay bone healing. If you  need help quitting, ask your health care provider.  The day before your surgery, follow instructions from your health care provider for showering, eating, drinking, and taking medicines. These directions are for your safety. What kinds of exercises should I do? Your health care provider may have you do the following exercises before your surgery. Be sure to follow the exercise program only as directed by your health care provider. You should not feel pain while performing these exercises. Document Released: 01/16/2011 Document Revised: 11/14/2016 Document Reviewed: 01/04/2014 Elsevier Interactive Patient Education  [employer].

## 2018-05-17 NOTE — Progress Notes (Signed)
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  At this point he is interested in considering the next steps.  I reviewed the past medical history, family history, social history, surgical history, and allergies today and no changes were needed.  Please see the problem list section below in epic for further details.  Past Medical History: Past Medical History:  Diagnosis Date  . Arthritis    knees   Past Surgical History: Past Surgical History:  Procedure Laterality Date  . KNEE ARTHROSCOPY Bilateral 1992, 2008  . ruptured achilles tendon Left 1992  . TONSILLECTOMY AND ADENOIDECTOMY  1961   Social History: Social History   Socioeconomic History  . Marital status: Married    Spouse name: Not on file  . Number of children: Not on file  . Years of education: Not on file  . Highest education level: Not on file  Occupational History  . Not on file  Social Needs  . Financial resource strain: Not on file  . Food insecurity:    Worry: Not on file    Inability: Not on file  . Transportation needs:    Medical: Not on file    Non-medical: Not on file  Tobacco Use  . Smoking status: Never Smoker  . Smokeless tobacco: Never Used  Substance and Sexual Activity  . Alcohol use: Yes    Alcohol/week: 0.6 oz    Types: 1 Glasses of wine per week  . Drug use: No  . Sexual activity: Yes  Lifestyle  . Physical activity:    Days per week: Not on file    Minutes per session: Not on file  . Stress: Not on file  Relationships  . Social connections:    Talks on phone: Not on file    Gets together: Not on file    Attends religious  service: Not on file    Active member of club or organization: Not on file    Attends meetings of clubs or organizations: Not on file    Relationship status: Not on file  Other Topics Concern  . Not on file  Social History Narrative  . Not on file   Family History: Family History  Problem Relation Age of Onset  . Hyperlipidemia Father   . Hypertension Father   . Heart attack Father   . Colon cancer Neg Hx    Allergies: Allergies  Allergen Reactions  . Ivp Dye [Iodinated Diagnostic Agents] Nausea And Vomiting    Comment: iodine dye   Medications: See med rec.  Review of Systems: No fevers, chills, night sweats, weight loss, chest pain, or shortness of breath.   Objective:    General: Well Developed, well nourished, and in no acute distress.  Neuro: Alert and oriented x3, extra-ocular muscles intact, sensation grossly intact.  HEENT: Normocephalic, atraumatic, pupils equal round reactive to light, neck supple, no masses, no lymphadenopathy, thyroid nonpalpable.  Skin: Warm and dry, no rashes. Cardiac: Regular rate and rhythm, no murmurs rubs or gallops, no lower extremity edema.  Respiratory: Clear to auscultation bilaterally. Not using accessory muscles, speaking in full sentences.  Impression and  Recommendations:    Primary osteoarthritis of both knees Orthovisc No. 4 of 4 was finished about a month and a half ago, Celebrex, therapy, all of the above done, no sufficient relief. At this point he is a candidate for total knee arthroplasty on the left. Referral to Dr. Berenice Primas. He has greater than 4 metabolic equivalents of cardiac capacity, he is medically cleared. He will work on quad and hamstring strengthening in the meantime. ___________________________________________ Gwen Her. Dianah Field, M.D., ABFM., CAQSM. Primary Care and [place] Instructor of [place] [facility]  [facility]

## 2018-05-17 NOTE — Assessment & Plan Note (Signed)
Orthovisc No. 4 of 4 was finished about a month and a half ago, Celebrex, therapy, all of the above done, no sufficient relief. At this point he is a candidate for total knee arthroplasty on the left. Referral to Dr. Berenice Primas. He has greater than 4 metabolic equivalents of cardiac capacity, he is medically cleared. He will work on quad and hamstring strengthening in the meantime.

## 2018-07-04 ENCOUNTER — Telehealth: Payer: Self-pay

## 2018-07-04 DIAGNOSIS — D229 Melanocytic nevi, unspecified: Secondary | ICD-10-CM

## 2018-07-04 NOTE — Telephone Encounter (Signed)
Jeffery Anthony's wife called today and said that Jeffery Anthony has recently developed a small mole on his chest and was wanting to know if they should get a referral to a dermatologist? Or if they should just come in first and see you? Please advise.

## 2018-07-04 NOTE — Telephone Encounter (Signed)
Come in and lets take a look at it first.

## 2018-07-06 NOTE — Telephone Encounter (Signed)
Called patients wife and she preferrs to go to Dermatology first and not be seen in office. PCP is aware and referral has been placed.

## 2018-08-22 ENCOUNTER — Encounter: Payer: Self-pay | Admitting: Physician Assistant

## 2018-08-30 ENCOUNTER — Encounter (HOSPITAL_COMMUNITY): Payer: Self-pay | Admitting: *Deleted

## 2018-08-30 ENCOUNTER — Other Ambulatory Visit: Payer: Self-pay | Admitting: Orthopedic Surgery

## 2018-09-05 NOTE — Patient Instructions (Addendum)
Jeffery Anthony  09/05/2018   Your procedure is scheduled on: Friday 09/16/2018  Report [facility]  Entrance              Report to admitting at  0530  AM    Call this number if you have problems the morning of surgery [phone]    Remember: Do not eat food or drink liquids :After Midnight.              BRUSH YOUR TEETH MORNING OF SURGERY AND RINSE YOUR MOUTH OUT, NO CHEWING GUM CANDY OR MINTS.     Take these medicines the morning of surgery with A SIP OF WATER: use eye drops if needed                                You may not have any metal on your body including hair pins and              piercings  Do not wear jewelry, make-up, lotions, powders or perfumes, deodorant              Men may shave face and neck.   Do not bring valuables to the hospital. [place].  Contacts, dentures or bridgework may not be worn into surgery.  Leave suitcase in the car. After surgery it may be brought to your room.                  Please read over the following fact sheets you were given: _____________________________________________________________________             [facility] - Preparing for Surgery Before surgery, you can play an important role.  Because skin is not sterile, your skin needs to be as free of germs as possible.  You can reduce the number of germs on your skin by washing with CHG (chlorahexidine gluconate) soap before surgery.  CHG is an antiseptic cleaner which kills germs and bonds with the skin to continue killing germs even after washing. Please DO NOT use if you have an allergy to CHG or antibacterial soaps.  If your skin becomes reddened/irritated stop using the CHG and inform your nurse when you arrive at Short Stay. Do not shave (including legs and underarms) for at least 48 hours prior to the first CHG shower.  You may shave your face/neck. Please follow these instructions  carefully:  1.  Shower with CHG Soap the night before surgery and the  morning of Surgery.  2.  If you choose to wash your hair, wash your hair first as usual with your  normal  shampoo.  3.  After you shampoo, rinse your hair and body thoroughly to remove the  shampoo.                           4.  Use CHG as you would any other liquid soap.  You can apply chg directly  to the skin and wash                       Gently with a scrungie or clean washcloth.  5.  Apply the CHG Soap to your body  ONLY FROM THE NECK DOWN.   Do not use on face/ open                           Wound or open sores. Avoid contact with eyes, ears mouth and genitals (private parts).                       Wash face,  Genitals (private parts) with your normal soap.             6.  Wash thoroughly, paying special attention to the area where your surgery  will be performed.  7.  Thoroughly rinse your body with warm water from the neck down.  8.  DO NOT shower/wash with your normal soap after using and rinsing off  the CHG Soap.                9.  Pat yourself dry with a clean towel.            10.  Wear clean pajamas.            11.  Place clean sheets on your bed the night of your first shower and do not  sleep with pets. Day of Surgery : Do not apply any lotions/deodorants the morning of surgery.  Please wear clean clothes to the hospital/surgery center.  FAILURE TO FOLLOW THESE INSTRUCTIONS MAY RESULT IN THE CANCELLATION OF YOUR SURGERY PATIENT SIGNATURE_________________________________  NURSE SIGNATURE__________________________________  ________________________________________________________________________   Adam Phenix  An incentive spirometer is a tool that can help keep your lungs clear and active. This tool measures how well you are filling your lungs with each breath. Taking long deep breaths may help reverse or decrease the chance of developing breathing (pulmonary) problems (especially infection)  following:  A long period of time when you are unable to move or be active. BEFORE THE PROCEDURE   If the spirometer includes an indicator to show your best effort, your nurse or respiratory therapist will set it to a desired goal.  If possible, sit up straight or lean slightly forward. Try not to slouch.  Hold the incentive spirometer in an upright position. INSTRUCTIONS FOR USE  1. Sit on the edge of your bed if possible, or sit up as far as you can in bed or on a chair. 2. Hold the incentive spirometer in an upright position. 3. Breathe out normally. 4. Place the mouthpiece in your mouth and seal your lips tightly around it. 5. Breathe in slowly and as deeply as possible, raising the piston or the ball toward the top of the column. 6. Hold your breath for 3-5 seconds or for as long as possible. Allow the piston or ball to fall to the bottom of the column. 7. Remove the mouthpiece from your mouth and breathe out normally. 8. Rest for a few seconds and repeat Steps 1 through 7 at least 10 times every 1-2 hours when you are awake. Take your time and take a few normal breaths between deep breaths. 9. The spirometer may include an indicator to show your best effort. Use the indicator as a goal to work toward during each repetition. 10. After each set of 10 deep breaths, practice coughing to be sure your lungs are clear. If you have an incision (the cut made at the time of surgery), support your incision when coughing by placing a pillow or rolled up towels firmly against it. Once  you are able to get out of bed, walk around indoors and cough well. You may stop using the incentive spirometer when instructed by your caregiver.  RISKS AND COMPLICATIONS  Take your time so you do not get dizzy or light-headed.  If you are in pain, you may need to take or ask for pain medication before doing incentive spirometry. It is harder to take a deep breath if you are having pain. AFTER USE  Rest and  breathe slowly and easily.  It can be helpful to keep track of a log of your progress. Your caregiver can provide you with a simple table to help with this. If you are using the spirometer at home, follow these instructions: [place] IF:   You are having difficultly using the spirometer.  You have trouble using the spirometer as often as instructed.  Your pain medication is not giving enough relief while using the spirometer.  You develop fever of 100.5 F (38.1 C) or higher. SEEK IMMEDIATE MEDICAL CARE IF:   You cough up bloody sputum that had not been present before.  You develop fever of 102 F (38.9 C) or greater.  You develop worsening pain at or near the incision site. MAKE SURE YOU:   Understand these instructions.  Will watch your condition.  Will get help right away if you are not doing well or get worse. Document Released: 02/22/2007 Document Revised: 01/04/2012 Document Reviewed: 04/25/2007 [facility] Patient Information 2014 [place], [place].   ________________________________________________________________________

## 2018-09-09 ENCOUNTER — Ambulatory Visit (HOSPITAL_COMMUNITY)
Admission: RE | Admit: 2018-09-09 | Discharge: 2018-09-09 | Disposition: A | Discharge status: Home / Self Care | Payer: Commercial Managed Care - PPO | Source: Ambulatory Visit | Attending: Orthopedic Surgery | Admitting: Orthopedic Surgery

## 2018-09-09 ENCOUNTER — Encounter (HOSPITAL_COMMUNITY)
Admission: RE | Admit: 2018-09-09 | Discharge: 2018-09-09 | Disposition: A | Discharge status: Home / Self Care | Payer: Commercial Managed Care - PPO | Source: Ambulatory Visit | Attending: Orthopedic Surgery | Admitting: Orthopedic Surgery

## 2018-09-09 ENCOUNTER — Other Ambulatory Visit: Payer: Self-pay

## 2018-09-09 ENCOUNTER — Encounter (HOSPITAL_COMMUNITY): Payer: Self-pay

## 2018-09-09 DIAGNOSIS — M1712 Unilateral primary osteoarthritis, left knee: Secondary | ICD-10-CM | POA: Insufficient documentation

## 2018-09-09 DIAGNOSIS — Z01818 Encounter for other preprocedural examination: Secondary | ICD-10-CM | POA: Insufficient documentation

## 2018-09-09 HISTORY — DX: Headache, unspecified: R51.9

## 2018-09-09 HISTORY — DX: Headache: R51

## 2018-09-09 HISTORY — DX: Gastro-esophageal reflux disease without esophagitis: K21.9

## 2018-09-09 LAB — COMPREHENSIVE METABOLIC PANEL
ALT: 18 U/L (ref 0–44)
AST: 21 U/L (ref 15–41)
Albumin: 4.4 g/dL (ref 3.5–5.0)
Alkaline Phosphatase: 44 U/L (ref 38–126)
Anion gap: 7 (ref 5–15)
BUN: 16 mg/dL (ref 8–23)
CO2: 28 mmol/L (ref 22–32)
Calcium: 9.3 mg/dL (ref 8.9–10.3)
Chloride: 109 mmol/L (ref 98–111)
Creatinine, Ser: 1.05 mg/dL (ref 0.61–1.24)
GFR calc Af Amer: >60 mL/min (ref >60)
GFR calc non Af Amer: >60 mL/min (ref >60)
Glucose, Bld: 104 mg/dL — ABNORMAL HIGH (ref 70–99)
Potassium: 4.5 mmol/L (ref 3.5–5.1)
Sodium: 144 mmol/L (ref 135–145)
Total Bilirubin: 0.6 mg/dL (ref 0.3–1.2)
Total Protein: 7.2 g/dL (ref 6.5–8.1)

## 2018-09-09 LAB — CBC WITH DIFFERENTIAL/PLATELET
Abs Immature Granulocytes: 0.1 10*3/uL — ABNORMAL HIGH (ref 0.00–0.07)
Basophils Absolute: 0.1 10*3/uL (ref 0.0–0.1)
Basophils Relative: 1 %
Eosinophils Absolute: 0.2 10*3/uL (ref 0.0–0.5)
Eosinophils Relative: 3 %
HCT: 42.1 % (ref 39.0–52.0)
Hemoglobin: 14.4 g/dL (ref 13.0–17.0)
Immature Granulocytes: 2 %
Lymphocytes Relative: 26 %
Lymphs Abs: 1.7 10*3/uL (ref 0.7–4.0)
MCH: 41.6 pg — ABNORMAL HIGH (ref 26.0–34.0)
MCHC: 34.2 g/dL (ref 30.0–36.0)
MCV: 121.7 fL — ABNORMAL HIGH (ref 80.0–100.0)
Monocytes Absolute: 0.7 10*3/uL (ref 0.1–1.0)
Monocytes Relative: 10 %
Neutro Abs: 3.8 10*3/uL (ref 1.7–7.7)
Neutrophils Relative %: 58 %
Platelets: 289 10*3/uL (ref 150–400)
RBC: 3.46 MIL/uL — ABNORMAL LOW (ref 4.22–5.81)
RDW: 17.2 % — ABNORMAL HIGH (ref 11.5–15.5)
WBC: 6.6 10*3/uL (ref 4.0–10.5)
nRBC: 0 % (ref 0.0–0.2)

## 2018-09-09 LAB — URINALYSIS, ROUTINE W REFLEX MICROSCOPIC
Bilirubin Urine: NEGATIVE
Glucose, UA: NEGATIVE mg/dL
Ketones, ur: NEGATIVE mg/dL
Leukocytes, UA: NEGATIVE
Nitrite: NEGATIVE
Protein, ur: NEGATIVE mg/dL
Specific Gravity, Urine: 1.017 (ref 1.005–1.030)
pH: 5 (ref 5.0–8.0)

## 2018-09-09 LAB — SURGICAL PCR SCREEN
MRSA, PCR: NEGATIVE
Staphylococcus aureus: NEGATIVE

## 2018-09-09 LAB — PROTIME-INR
INR: 0.94
Prothrombin Time: 12.5 seconds (ref 11.4–15.2)

## 2018-09-09 LAB — APTT: aPTT: 30 seconds (ref 24–36)

## 2018-09-09 LAB — ABO/RH: ABO/RH(D): A POS

## 2018-09-09 NOTE — Treatment Plan (Signed)
I called the patient and he stated that he does not have sleep apnea and does not want the sleep study.

## 2018-09-09 NOTE — Progress Notes (Signed)
   09/09/18 0815  OBSTRUCTIVE SLEEP APNEA  Have you ever been diagnosed with sleep apnea through a sleep study? No  Do you snore loudly (loud enough to be heard through closed doors)?  1  Do you often feel tired, fatigued, or sleepy during the daytime (such as falling asleep during driving or talking to someone)? 0  Has anyone observed you stop breathing during your sleep? 1  Do you have, or are you being treated for high blood pressure? 0  BMI more than 35 kg/m2? 0  Age > 50 (1-yes) 1  Neck circumference greater than:Male 16 inches or larger, Male 17inches or larger? 1  Male Gender (Yes=1) 1  Obstructive Sleep Apnea Score 5  Score 5 or greater  Results sent to PCP

## 2018-09-15 NOTE — H&P (Signed)
TOTAL KNEE ADMISSION H&P  Patient is being admitted for left total knee arthroplasty.  Subjective:  Chief Complaint:left knee pain.  HPI: Jeffery Anthony, 67 y.o. male, has a history of pain and functional disability in the left knee due to arthritis and has failed non-surgical conservative treatments for greater than 12 weeks to includeNSAID's and/or analgesics, corticosteriod injections, viscosupplementation injections, weight reduction as appropriate and activity modification.  Onset of symptoms was gradual, starting 4 years ago with gradually worsening course since that time. The patient noted no past surgery on the left knee(s).  Patient currently rates pain in the left knee(s) at 9 out of 10 with activity. Patient has night pain, worsening of pain with activity and weight bearing, pain that interferes with activities of daily living, pain with passive range of motion and crepitus.  Patient has evidence of subchondral cysts, subchondral sclerosis and joint space narrowing by imaging studies. This patient has had failure of all reasonable conservative care. There is no active infection.  Patient Active Problem List   Diagnosis Date Noted  . Benign paroxysmal positional vertigo of right ear 11/17/2017  . 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 Migraine 12/01/2016  . Colon polyp 10/04/2013  . Primary osteoarthritis of both knees 07/10/2013   Past Medical History:  Diagnosis Date  . Arthritis    knees  . GERD (gastroesophageal reflux disease)   . Headache    migraines    Past Surgical History:  Procedure Laterality Date  . KNEE ARTHROSCOPY Bilateral 1992, 2008  . ruptured achilles tendon Left 1992  . TONSILLECTOMY AND ADENOIDECTOMY  1961    No current facility-administered medications for this encounter.    Current Outpatient Medications  Medication Sig Dispense Refill Last Dose  . amoxicillin-clavulanate (AUGMENTIN) 875-125 MG tablet Take 1 tablet by  mouth 2 (two) times daily.     Marland Kitchen aspirin-acetaminophen-caffeine (EXCEDRIN EXTRA STRENGTH) 250-250-65 MG tablet Take 1 tablet by mouth every 6 (six) hours as needed for migraine.     . butalbital-acetaminophen-caffeine (FIORICET, ESGIC) 50-325-40 MG tablet TAKE 1 TO 2 TABLETS TWICE A DAY AS NEEDED FOR HEADACHE (Patient taking differently: Take 1-2 tablets by mouth 2 (two) times daily as needed for headache. ) 14 tablet 0 Taking  . celecoxib (CELEBREX) 200 MG capsule One to 2 tablets by mouth daily as needed for pain. (Patient taking differently: Take 200 mg by mouth 2 (two) times daily as needed for moderate pain. One to 2 tablets by mouth daily as needed for pain.) 60 capsule 2 Taking  . Doxylamine-Phenylephrine-APAP (VICKS NYQUIL SINUS PO) Take 30 mLs by mouth at bedtime.     Marland Kitchen ibuprofen (ADVIL,MOTRIN) 200 MG tablet Take 600 mg by mouth every 8 (eight) hours as needed for moderate pain.     . Pseudoephedrine HCl, Deter, 30 MG TABA Take 60 mg by mouth every 6 (six) hours as needed (nasal congestion).     . timolol (TIMOPTIC) 0.5 % ophthalmic solution Place 1 drop into both eyes 2 (two) times daily as needed (headache/eye pressure).   3 Taking  . ciprofloxacin (CIPRO) 500 MG tablet Take 1 tablet (500 mg total) by mouth 2 (two) times daily. For 10 days. (Patient not taking: Reported on 09/08/2018) 20 tablet 0 Not Taking at Unknown time  . meclizine (ANTIVERT) 25 MG tablet Take 1 tablet (25 mg total) by mouth 3 (three) times daily as needed for dizziness or nausea. (Patient not taking: Reported on 09/08/2018) 30 tablet 1 Not Taking at Unknown  time  . SUMAtriptan (IMITREX) 50 MG tablet Take 1 tablet (50 mg total) by mouth every 2 (two) hours as needed for migraine. May repeat in 2 hours if headache persists or recurs. (Patient not taking: Reported on 09/08/2018) 10 tablet 1 Not Taking at Unknown time   Allergies  Allergen Reactions  . Ivp Dye [Iodinated Diagnostic Agents] Nausea And Vomiting    iodine dye     Social History   Tobacco Use  . Smoking status: Never Smoker  . Smokeless tobacco: Never Used  Substance Use Topics  . Alcohol use: Yes    Alcohol/week: 1.0 standard drinks    Types: 1 Glasses of wine per week    Family History  Problem Relation Age of Onset  . Hyperlipidemia Father   . Hypertension Father   . Heart attack Father   . Colon cancer Neg Hx      ROS ROS: I have reviewed the patient's review of systems thoroughly and there are no positive responses as relates to the HPI. Objective:  Physical Exam  Vital signs in last 24 hours:   Well-developed well-nourished patient in no acute distress. Alert and oriented x3 HEENT:within normal limits Cardiac: Regular rate and rhythm Pulmonary: Lungs clear to auscultation Abdomen: Soft and nontender.  Normal active bowel sounds  Musculoskeletal: left knee: Painful range of motion.  Limited range of motion.  No instability.  Trace effusion.  Neurovascular intact distally. Labs:  Recent Results (from the past 2160 hour(s))  Surgical pcr screen     Status: None   Collection Time: 09/09/18  8:30 AM  Result Value Ref Range   MRSA, PCR NEGATIVE NEGATIVE   Staphylococcus aureus NEGATIVE NEGATIVE    Comment: (NOTE) The Xpert SA Assay (FDA approved for NASAL specimens in patients 40 years of age and older), is one component of a comprehensive surveillance program. It is not intended to diagnose infection nor to guide or monitor treatment. Performed at [facility], [place] [address], [place], [place] [address]   APTT     Status: None   Collection Time: 09/09/18  8:30 AM  Result Value Ref Range   aPTT 30 24 - 36 seconds    Comment: Performed at [facility], [place] [address] [place], [place] [address]  CBC WITH DIFFERENTIAL     Status: Abnormal   Collection Time: 09/09/18  8:30 AM  Result Value Ref Range   WBC 6.6 4.0 - 10.5 K/uL   RBC 3.46 (L) 4.22 - 5.81 MIL/uL   Hemoglobin 14.4  13.0 - 17.0 g/dL   HCT 42.1 39.0 - 52.0 %   MCV 121.7 (H) 80.0 - 100.0 fL   MCH 41.6 (H) 26.0 - 34.0 pg   MCHC 34.2 30.0 - 36.0 g/dL   RDW 17.2 (H) 11.5 - 15.5 %   Platelets 289 150 - 400 K/uL   nRBC 0.0 0.0 - 0.2 %   Neutrophils Relative % 58 %   Neutro Abs 3.8 1.7 - 7.7 K/uL   Lymphocytes Relative 26 %   Lymphs Abs 1.7 0.7 - 4.0 K/uL   Monocytes Relative 10 %   Monocytes Absolute 0.7 0.1 - 1.0 K/uL   Eosinophils Relative 3 %   Eosinophils Absolute 0.2 0.0 - 0.5 K/uL   Basophils Relative 1 %   Basophils Absolute 0.1 0.0 - 0.1 K/uL   RBC Morphology MORPHOLOGY UNREMARKABLE    Immature Granulocytes 2 %   Abs Immature Granulocytes 0.10 (H) 0.00 - 0.07  K/uL    Comment: Performed at [facility], [place] [address], [place], [place] [address]  Comprehensive metabolic panel     Status: Abnormal   Collection Time: 09/09/18  8:30 AM  Result Value Ref Range   Sodium 144 135 - 145 mmol/L   Potassium 4.5 3.5 - 5.1 mmol/L   Chloride 109 98 - 111 mmol/L   CO2 28 22 - 32 mmol/L   Glucose, Bld 104 (H) 70 - 99 mg/dL   BUN 16 8 - 23 mg/dL   Creatinine, Ser 1.05 0.61 - 1.24 mg/dL   Calcium 9.3 8.9 - 10.3 mg/dL   Total Protein 7.2 6.5 - 8.1 g/dL   Albumin 4.4 3.5 - 5.0 g/dL   AST 21 15 - 41 U/L   ALT 18 0 - 44 U/L   Alkaline Phosphatase 44 38 - 126 U/L   Total Bilirubin 0.6 0.3 - 1.2 mg/dL   GFR calc non Af Amer >60 >60 mL/min   GFR calc Af Amer >60 >60 mL/min    Comment: (NOTE) The eGFR has been calculated using the CKD EPI equation. This calculation has not been validated in all clinical situations. eGFR's persistently <60 mL/min signify possible Chronic Kidney Disease.    Anion gap 7 5 - 15    Comment: Performed at [facility], [place] [address], [place], [place] [address]  Protime-INR     Status: None   Collection Time: 09/09/18  8:30 AM  Result Value Ref Range   Prothrombin Time 12.5 11.4 - 15.2 seconds   INR 0.94     Comment: Performed at  [facility], [place] [address], [place], [place] [address]  Type and screen Order type and screen if day of surgery is less than 15 days from draw of preadmission visit or order morning of surgery if day of surgery is greater than 6 days from preadmission visit.     Status: None   Collection Time: 09/09/18  8:30 AM  Result Value Ref Range   ABO/RH(D) A POS    Antibody Screen NEG    Sample Expiration 09/23/2018    Extend sample reason      NO TRANSFUSIONS OR PREGNANCY IN THE PAST 3 MONTHS Performed at [facility], [place] [address], [place], [place] [address]   ABO/Rh     Status: None   Collection Time: 09/09/18  8:30 AM  Result Value Ref Range   ABO/RH(D)      A POS Performed at [facility], [place] [address] [place], [place] [address]   Urinalysis, Routine w reflex microscopic     Status: Abnormal   Collection Time: 09/09/18  8:51 AM  Result Value Ref Range   Color, Urine YELLOW YELLOW   APPearance CLEAR CLEAR   Specific Gravity, Urine 1.017 1.005 - 1.030   pH 5.0 5.0 - 8.0   Glucose, UA NEGATIVE NEGATIVE mg/dL   Hgb urine dipstick SMALL (A) NEGATIVE   Bilirubin Urine NEGATIVE NEGATIVE   Ketones, ur NEGATIVE NEGATIVE mg/dL   Protein, ur NEGATIVE NEGATIVE mg/dL   Nitrite NEGATIVE NEGATIVE   Leukocytes, UA NEGATIVE NEGATIVE   RBC / HPF 0-5 0 - 5 RBC/hpf   WBC, UA 0-5 0 - 5 WBC/hpf   Bacteria, UA RARE (A) NONE SEEN   Mucus PRESENT     Comment: Performed at [facility], [place] [address], [place], [place] [address]   Estimated body mass index is 32.86  kg/m as calculated from the following:   Height as of 09/09/18: _0  (1.803 m).   Weight as of 09/09/18: 106.9 kg.   Imaging Review Plain radiographs demonstrate severe degenerative joint disease of the left knee(s). The overall alignment ismild varus. The bone quality appears to be good for age and reported activity level.   Preoperative templating of  the joint replacement has been completed, documented, and submitted to the Operating Room personnel in order to optimize intra-operative equipment management.    Patient's anticipated LOS is less than 2 midnights, meeting these requirements: - Younger than 25 - Lives within 1 hour of care - Has a competent adult at home to recover with post-op recover - NO history of  - Chronic pain requiring opiods  - Diabetes  - Coronary Artery Disease  - Heart failure  - Heart attack  - Stroke  - DVT/VTE  - Cardiac arrhythmia  - Respiratory Failure/COPD  - Renal failure  - Anemia  - Advanced Liver disease        Assessment/Plan:  End stage arthritis, left knee   The patient history, physical examination, clinical judgment of the provider and imaging studies are consistent with end stage degenerative joint disease of the left knee(s) and total knee arthroplasty is deemed medically necessary. The treatment options including medical management, injection therapy arthroscopy and arthroplasty were discussed at length. The risks and benefits of total knee arthroplasty were presented and reviewed. The risks due to aseptic loosening, infection, stiffness, patella tracking problems, thromboembolic complications and other imponderables were discussed. The patient acknowledged the explanation, agreed to proceed with the plan and consent was signed. Patient is being admitted for inpatient treatment for surgery, pain control, PT, OT, prophylactic antibiotics, VTE prophylaxis, progressive ambulation and ADL's and discharge planning. The patient is planning to be discharged home with home health services

## 2018-09-16 ENCOUNTER — Ambulatory Visit (HOSPITAL_COMMUNITY): Payer: Commercial Managed Care - PPO | Admitting: Anesthesiology

## 2018-09-16 ENCOUNTER — Encounter (HOSPITAL_COMMUNITY): Payer: Self-pay | Admitting: Anesthesiology

## 2018-09-16 ENCOUNTER — Ambulatory Visit (HOSPITAL_COMMUNITY)
Admission: RE | Admit: 2018-09-16 | Discharge: 2018-09-17 | Disposition: A | Discharge status: Home / Self Care | Payer: Commercial Managed Care - PPO | Source: Ambulatory Visit | Attending: Orthopedic Surgery | Admitting: Orthopedic Surgery

## 2018-09-16 ENCOUNTER — Encounter (HOSPITAL_COMMUNITY)
Admission: RE | Disposition: A | Discharge status: Home / Self Care | Payer: Self-pay | Source: Ambulatory Visit | Attending: Orthopedic Surgery

## 2018-09-16 ENCOUNTER — Other Ambulatory Visit: Payer: Self-pay

## 2018-09-16 DIAGNOSIS — M1712 Unilateral primary osteoarthritis, left knee: Secondary | ICD-10-CM | POA: Diagnosis present

## 2018-09-16 DIAGNOSIS — Z888 Allergy status to other drugs, medicaments and biological substances status: Secondary | ICD-10-CM | POA: Diagnosis not present

## 2018-09-16 HISTORY — PX: TOTAL KNEE ARTHROPLASTY: SHX125

## 2018-09-16 LAB — TYPE AND SCREEN
ABO/RH(D): A POS
Antibody Screen: NEGATIVE

## 2018-09-16 SURGERY — ARTHROPLASTY, KNEE, TOTAL
Anesthesia: Spinal | Site: Knee | Laterality: Left

## 2018-09-16 MED ORDER — BUPIVACAINE-EPINEPHRINE (PF) 0.25% -1:200000 IJ SOLN
INTRAMUSCULAR | Status: AC
  Filled 2018-09-16: qty 30

## 2018-09-16 MED ORDER — SODIUM CHLORIDE 0.9% FLUSH
INTRAVENOUS | Status: DC | PRN
  Administered 2018-09-16: 50 mL

## 2018-09-16 MED ORDER — DEXAMETHASONE SODIUM PHOSPHATE 10 MG/ML IJ SOLN
INTRAMUSCULAR | Status: DC | PRN
  Administered 2018-09-16: 10 mg via INTRAVENOUS

## 2018-09-16 MED ORDER — BUPIVACAINE-EPINEPHRINE 0.25% -1:200000 IJ SOLN
INTRAMUSCULAR | Status: DC | PRN
  Administered 2018-09-16: 30 mL

## 2018-09-16 MED ORDER — ONDANSETRON HCL 4 MG/2ML IJ SOLN
INTRAMUSCULAR | Status: DC | PRN
  Administered 2018-09-16: 4 mg via INTRAVENOUS

## 2018-09-16 MED ORDER — ACETAMINOPHEN 325 MG PO TABS: 325.0000 mg | ORAL_TABLET | Freq: Four times a day (QID) | ORAL | Status: DC | PRN

## 2018-09-16 MED ORDER — MAGNESIUM CITRATE PO SOLN: 1.0000 | Freq: Once | ORAL | Status: DC | PRN

## 2018-09-16 MED ORDER — POLYETHYLENE GLYCOL 3350 17 G PO PACK: 17.0000 g | PACK | Freq: Every day | ORAL | Status: DC | PRN

## 2018-09-16 MED ORDER — OXYCODONE-ACETAMINOPHEN 5-325 MG PO TABS: 1.0000 | ORAL_TABLET | Freq: Four times a day (QID) | ORAL | 0 refills | Status: DC | PRN

## 2018-09-16 MED ORDER — ONDANSETRON HCL 4 MG/2ML IJ SOLN
INTRAMUSCULAR | Status: AC
  Filled 2018-09-16: qty 2

## 2018-09-16 MED ORDER — BUPIVACAINE LIPOSOME 1.3 % IJ SUSP
INTRAMUSCULAR | Status: DC | PRN
  Administered 2018-09-16: 20 mL

## 2018-09-16 MED ORDER — DOCUSATE SODIUM 100 MG PO CAPS: 100.0000 mg | ORAL_CAPSULE | Freq: Two times a day (BID) | ORAL | 0 refills | Status: DC

## 2018-09-16 MED ORDER — PROMETHAZINE HCL 25 MG/ML IJ SOLN
12.5000 mg | Freq: Four times a day (QID) | INTRAMUSCULAR | Status: DC | PRN
  Administered 2018-09-16: 12.5 mg via INTRAVENOUS
  Filled 2018-09-16: qty 1

## 2018-09-16 MED ORDER — BUPIVACAINE LIPOSOME 1.3 % IJ SUSP
20.0000 mL | Freq: Once | INTRAMUSCULAR | Status: DC
  Filled 2018-09-16: qty 20

## 2018-09-16 MED ORDER — PROPOFOL 10 MG/ML IV BOLUS
INTRAVENOUS | Status: AC
  Filled 2018-09-16: qty 60

## 2018-09-16 MED ORDER — MIDAZOLAM HCL 5 MG/5ML IJ SOLN
INTRAMUSCULAR | Status: DC | PRN
  Administered 2018-09-16: 2 mg via INTRAVENOUS

## 2018-09-16 MED ORDER — TRANEXAMIC ACID-NACL 1000-0.7 MG/100ML-% IV SOLN
1000.0000 mg | INTRAVENOUS | Status: AC
  Administered 2018-09-16: 1000 mg via INTRAVENOUS
  Filled 2018-09-16 (×2): qty 100

## 2018-09-16 MED ORDER — LACTATED RINGERS IV SOLN
INTRAVENOUS | Status: DC
  Administered 2018-09-16 (×2): via INTRAVENOUS

## 2018-09-16 MED ORDER — MIDAZOLAM HCL 2 MG/2ML IJ SOLN
INTRAMUSCULAR | Status: AC
  Filled 2018-09-16: qty 2

## 2018-09-16 MED ORDER — CEFAZOLIN SODIUM-DEXTROSE 2-4 GM/100ML-% IV SOLN
2.0000 g | INTRAVENOUS | Status: AC
  Administered 2018-09-16: 2 g via INTRAVENOUS
  Filled 2018-09-16: qty 100

## 2018-09-16 MED ORDER — DIPHENHYDRAMINE HCL 12.5 MG/5ML PO ELIX: 12.5000 mg | ORAL_SOLUTION | ORAL | Status: DC | PRN

## 2018-09-16 MED ORDER — OXYCODONE HCL 5 MG/5ML PO SOLN
5.0000 mg | Freq: Once | ORAL | Status: DC | PRN
  Filled 2018-09-16: qty 5

## 2018-09-16 MED ORDER — OXYCODONE HCL 5 MG PO TABS
5.0000 mg | ORAL_TABLET | ORAL | Status: DC | PRN
  Administered 2018-09-16 – 2018-09-17 (×4): 10 mg via ORAL
  Filled 2018-09-16 (×4): qty 2

## 2018-09-16 MED ORDER — ALUM & MAG HYDROXIDE-SIMETH 200-200-20 MG/5ML PO SUSP: 30.0000 mL | ORAL | Status: DC | PRN

## 2018-09-16 MED ORDER — ONDANSETRON HCL 4 MG/2ML IJ SOLN
4.0000 mg | Freq: Four times a day (QID) | INTRAMUSCULAR | Status: DC | PRN
  Administered 2018-09-16: 4 mg via INTRAVENOUS
  Filled 2018-09-16 (×2): qty 2

## 2018-09-16 MED ORDER — PROPOFOL 10 MG/ML IV BOLUS
INTRAVENOUS | Status: AC
  Filled 2018-09-16: qty 20

## 2018-09-16 MED ORDER — METHOCARBAMOL 500 MG PO TABS
500.0000 mg | ORAL_TABLET | Freq: Four times a day (QID) | ORAL | Status: DC | PRN
  Administered 2018-09-17: 500 mg via ORAL
  Filled 2018-09-16: qty 1

## 2018-09-16 MED ORDER — FENTANYL CITRATE (PF) 100 MCG/2ML IJ SOLN: 25.0000 ug | INTRAMUSCULAR | Status: DC | PRN

## 2018-09-16 MED ORDER — PHENYLEPHRINE 40 MCG/ML (10ML) SYRINGE FOR IV PUSH (FOR BLOOD PRESSURE SUPPORT)
PREFILLED_SYRINGE | INTRAVENOUS | Status: AC
  Filled 2018-09-16: qty 10

## 2018-09-16 MED ORDER — FENTANYL CITRATE (PF) 100 MCG/2ML IJ SOLN
INTRAMUSCULAR | Status: DC | PRN
  Administered 2018-09-16 (×2): 50 ug via INTRAVENOUS

## 2018-09-16 MED ORDER — ASPIRIN EC 325 MG PO TBEC: 325.0000 mg | DELAYED_RELEASE_TABLET | Freq: Two times a day (BID) | ORAL | 0 refills | Status: DC

## 2018-09-16 MED ORDER — SODIUM CHLORIDE (PF) 0.9 % IJ SOLN
INTRAMUSCULAR | Status: AC
  Filled 2018-09-16: qty 50

## 2018-09-16 MED ORDER — SODIUM CHLORIDE 0.9 % IV SOLN
INTRAVENOUS | Status: DC
  Administered 2018-09-17: 09:00:00 via INTRAVENOUS

## 2018-09-16 MED ORDER — FENTANYL CITRATE (PF) 100 MCG/2ML IJ SOLN: 50.0000 ug | INTRAMUSCULAR | Status: DC

## 2018-09-16 MED ORDER — ONDANSETRON HCL 4 MG PO TABS: 4.0000 mg | ORAL_TABLET | Freq: Four times a day (QID) | ORAL | Status: DC | PRN

## 2018-09-16 MED ORDER — BUPIVACAINE IN DEXTROSE 0.75-8.25 % IT SOLN
INTRATHECAL | Status: DC | PRN
  Administered 2018-09-16: 1.8 mL via INTRATHECAL

## 2018-09-16 MED ORDER — CHLORHEXIDINE GLUCONATE 4 % EX LIQD: 60.0000 mL | Freq: Once | CUTANEOUS | Status: DC

## 2018-09-16 MED ORDER — ASPIRIN EC 325 MG PO TBEC
325.0000 mg | DELAYED_RELEASE_TABLET | Freq: Two times a day (BID) | ORAL | Status: DC
  Administered 2018-09-16 – 2018-09-17 (×2): 325 mg via ORAL
  Filled 2018-09-16 (×2): qty 1

## 2018-09-16 MED ORDER — TRANEXAMIC ACID-NACL 1000-0.7 MG/100ML-% IV SOLN
1000.0000 mg | Freq: Once | INTRAVENOUS | Status: AC
  Administered 2018-09-16: 1000 mg via INTRAVENOUS
  Filled 2018-09-16: qty 100

## 2018-09-16 MED ORDER — GABAPENTIN 300 MG PO CAPS: 300.0000 mg | ORAL_CAPSULE | Freq: Two times a day (BID) | ORAL | 0 refills | Status: DC

## 2018-09-16 MED ORDER — TAMSULOSIN HCL 0.4 MG PO CAPS
0.4000 mg | ORAL_CAPSULE | Freq: Every day | ORAL | Status: DC
  Administered 2018-09-16: 0.4 mg via ORAL
  Filled 2018-09-16: qty 1

## 2018-09-16 MED ORDER — HYDROMORPHONE HCL 1 MG/ML IJ SOLN
0.5000 mg | INTRAMUSCULAR | Status: DC | PRN
  Administered 2018-09-16 (×2): 1 mg via INTRAVENOUS
  Filled 2018-09-16 (×2): qty 1

## 2018-09-16 MED ORDER — PROPOFOL 500 MG/50ML IV EMUL
INTRAVENOUS | Status: DC | PRN
  Administered 2018-09-16: 100 ug/kg/min via INTRAVENOUS

## 2018-09-16 MED ORDER — TIZANIDINE HCL 2 MG PO TABS: 2.0000 mg | ORAL_TABLET | Freq: Three times a day (TID) | ORAL | 0 refills | Status: DC | PRN

## 2018-09-16 MED ORDER — FENTANYL CITRATE (PF) 100 MCG/2ML IJ SOLN
INTRAMUSCULAR | Status: AC
  Filled 2018-09-16: qty 2

## 2018-09-16 MED ORDER — WATER FOR IRRIGATION, STERILE IR SOLN
Status: DC | PRN
  Administered 2018-09-16: 2000 mL

## 2018-09-16 MED ORDER — MIDAZOLAM HCL 2 MG/2ML IJ SOLN: 1.0000 mg | INTRAMUSCULAR | Status: DC

## 2018-09-16 MED ORDER — 0.9 % SODIUM CHLORIDE (POUR BTL) OPTIME
TOPICAL | Status: DC | PRN
  Administered 2018-09-16: 1000 mL

## 2018-09-16 MED ORDER — TRANEXAMIC ACID-NACL 1000-0.7 MG/100ML-% IV SOLN: 1000.0000 mg | INTRAVENOUS | Status: DC

## 2018-09-16 MED ORDER — PHENYLEPHRINE 40 MCG/ML (10ML) SYRINGE FOR IV PUSH (FOR BLOOD PRESSURE SUPPORT)
PREFILLED_SYRINGE | INTRAVENOUS | Status: DC | PRN
  Administered 2018-09-16 (×2): 80 ug via INTRAVENOUS

## 2018-09-16 MED ORDER — DOCUSATE SODIUM 100 MG PO CAPS
100.0000 mg | ORAL_CAPSULE | Freq: Two times a day (BID) | ORAL | Status: DC
  Administered 2018-09-16 – 2018-09-17 (×2): 100 mg via ORAL
  Filled 2018-09-16 (×2): qty 1

## 2018-09-16 MED ORDER — METHOCARBAMOL 500 MG IVPB - SIMPLE MED
500.0000 mg | Freq: Four times a day (QID) | INTRAVENOUS | Status: DC | PRN
  Administered 2018-09-16: 500 mg via INTRAVENOUS
  Filled 2018-09-16: qty 50

## 2018-09-16 MED ORDER — DEXAMETHASONE SODIUM PHOSPHATE 10 MG/ML IJ SOLN
INTRAMUSCULAR | Status: AC
  Filled 2018-09-16: qty 1

## 2018-09-16 MED ORDER — OXYCODONE HCL 5 MG PO TABS: 5.0000 mg | ORAL_TABLET | Freq: Once | ORAL | Status: DC | PRN

## 2018-09-16 MED ORDER — DEXAMETHASONE SODIUM PHOSPHATE 10 MG/ML IJ SOLN
10.0000 mg | Freq: Two times a day (BID) | INTRAMUSCULAR | Status: DC
  Administered 2018-09-16 – 2018-09-17 (×2): 10 mg via INTRAVENOUS
  Filled 2018-09-16 (×2): qty 1

## 2018-09-16 MED ORDER — ONDANSETRON HCL 4 MG/2ML IJ SOLN: 4.0000 mg | Freq: Once | INTRAMUSCULAR | Status: DC | PRN

## 2018-09-16 MED ORDER — BISACODYL 5 MG PO TBEC: 5.0000 mg | DELAYED_RELEASE_TABLET | Freq: Every day | ORAL | Status: DC | PRN

## 2018-09-16 MED ORDER — HYDROMORPHONE HCL 1 MG/ML IJ SOLN
0.5000 mg | INTRAMUSCULAR | Status: DC | PRN
  Administered 2018-09-16: 1 mg via INTRAVENOUS
  Filled 2018-09-16: qty 1

## 2018-09-16 MED ORDER — CELECOXIB 200 MG PO CAPS
200.0000 mg | ORAL_CAPSULE | Freq: Two times a day (BID) | ORAL | Status: DC
  Administered 2018-09-16 – 2018-09-17 (×2): 200 mg via ORAL
  Filled 2018-09-16 (×2): qty 1

## 2018-09-16 MED ORDER — GABAPENTIN 300 MG PO CAPS
300.0000 mg | ORAL_CAPSULE | Freq: Two times a day (BID) | ORAL | Status: DC
  Administered 2018-09-16 – 2018-09-17 (×2): 300 mg via ORAL
  Filled 2018-09-16 (×2): qty 1

## 2018-09-16 MED ORDER — CEFAZOLIN SODIUM-DEXTROSE 2-4 GM/100ML-% IV SOLN
2.0000 g | Freq: Four times a day (QID) | INTRAVENOUS | Status: AC
  Administered 2018-09-16 (×2): 2 g via INTRAVENOUS
  Filled 2018-09-16 (×2): qty 100

## 2018-09-16 MED ORDER — SODIUM CHLORIDE 0.9 % IR SOLN
Status: DC | PRN
  Administered 2018-09-16: 1000 mL

## 2018-09-16 MED ORDER — METHOCARBAMOL 500 MG IVPB - SIMPLE MED
INTRAVENOUS | Status: AC
  Administered 2018-09-16: 500 mg via INTRAVENOUS
  Filled 2018-09-16: qty 50

## 2018-09-16 SURGICAL SUPPLY — 53 items
ATTUNE MED DOME PAT 41 KNEE (Knees) ×2 IMPLANT
ATTUNE PS FEM LT SZ 6 CEM KNEE (Femur) ×2 IMPLANT
ATTUNE PSRP INSR SZ6 5 KNEE (Insert) ×2 IMPLANT
BAG ZIPLOCK 12X15 (MISCELLANEOUS) ×2 IMPLANT
BANDAGE ACE 6X5 VEL STRL LF (GAUZE/BANDAGES/DRESSINGS) ×2 IMPLANT
BASE TIBIAL ROT PLAT SZ 7 KNEE (Knees) ×1 IMPLANT
BENZOIN TINCTURE PRP APPL 2/3 (GAUZE/BANDAGES/DRESSINGS) ×2 IMPLANT
BLADE SAGITTAL 25.0X1.19X90 (BLADE) ×2 IMPLANT
BLADE SAW SGTL 11.0X1.19X90.0M (BLADE) ×2 IMPLANT
BNDG ELASTIC 6X10 VLCR STRL LF (GAUZE/BANDAGES/DRESSINGS) ×2 IMPLANT
BOOTIES KNEE HIGH SLOAN (MISCELLANEOUS) ×2 IMPLANT
BOWL SMART MIX CTS (DISPOSABLE) ×2 IMPLANT
CEMENT HV SMART SET (Cement) ×4 IMPLANT
COVER WAND RF STERILE (DRAPES) IMPLANT
CUFF TOURN SGL QUICK 34 (TOURNIQUET CUFF) ×1
CUFF TRNQT CYL 34X4X40X1 (TOURNIQUET CUFF) ×1 IMPLANT
DECANTER SPIKE VIAL GLASS SM (MISCELLANEOUS) IMPLANT
DRAPE U-SHAPE 47X51 STRL (DRAPES) ×2 IMPLANT
DRSG AQUACEL AG ADV 3.5X10 (GAUZE/BANDAGES/DRESSINGS) ×2 IMPLANT
DURAPREP 26ML APPLICATOR (WOUND CARE) ×2 IMPLANT
ELECT REM PT RETURN 15FT ADLT (MISCELLANEOUS) ×2 IMPLANT
GLOVE BIOGEL PI IND STRL 8 (GLOVE) ×2 IMPLANT
GLOVE BIOGEL PI INDICATOR 8 (GLOVE) ×2
GLOVE ECLIPSE 7.5 STRL STRAW (GLOVE) ×4 IMPLANT
GOWN STRL REUS W/TWL XL LVL3 (GOWN DISPOSABLE) ×4 IMPLANT
HANDPIECE INTERPULSE COAX TIP (DISPOSABLE) ×1
HOLDER FOLEY CATH W/STRAP (MISCELLANEOUS) IMPLANT
HOOD PEEL AWAY FLYTE STAYCOOL (MISCELLANEOUS) ×6 IMPLANT
IMMOBILIZER KNEE 20 (SOFTGOODS) ×4 IMPLANT
IMMOBILIZER KNEE 20 THIGH 36 (SOFTGOODS) ×1 IMPLANT
MANIFOLD NEPTUNE II (INSTRUMENTS) ×2 IMPLANT
NEEDLE HYPO 22GX1.5 SAFETY (NEEDLE) ×2 IMPLANT
NS IRRIG 1000ML POUR BTL (IV SOLUTION) ×2 IMPLANT
PACK ICE MAXI GEL EZY WRAP (MISCELLANEOUS) ×2 IMPLANT
PACK TOTAL KNEE CUSTOM (KITS) ×2 IMPLANT
PADDING CAST COTTON 6X4 STRL (CAST SUPPLIES) ×2 IMPLANT
PIN STEINMAN FIXATION KNEE (PIN) ×2 IMPLANT
PIN THREADED HEADED SIGMA (PIN) ×2 IMPLANT
PROTECTOR NERVE ULNAR (MISCELLANEOUS) ×2 IMPLANT
SET HNDPC FAN SPRY TIP SCT (DISPOSABLE) ×1 IMPLANT
STAPLER VISISTAT 35W (STAPLE) IMPLANT
STRIP CLOSURE SKIN 1/2X4 (GAUZE/BANDAGES/DRESSINGS) ×2 IMPLANT
SUT MNCRL AB 3-0 PS2 18 (SUTURE) ×2 IMPLANT
SUT VIC AB 0 CT1 36 (SUTURE) ×2 IMPLANT
SUT VIC AB 1 CT1 36 (SUTURE) ×4 IMPLANT
SUT VIC AB 2-0 CT1 27 (SUTURE) ×2
SUT VIC AB 2-0 CT1 TAPERPNT 27 (SUTURE) ×2 IMPLANT
SYR CONTROL 10ML LL (SYRINGE) ×4 IMPLANT
TIBIAL BASE ROT PLAT SZ 7 KNEE (Knees) ×2 IMPLANT
TRAY FOLEY MTR SLVR 16FR STAT (SET/KITS/TRAYS/PACK) ×2 IMPLANT
WATER STERILE IRR 1000ML POUR (IV SOLUTION) ×4 IMPLANT
WRAP KNEE MAXI GEL POST OP (GAUZE/BANDAGES/DRESSINGS) ×2 IMPLANT
YANKAUER SUCT BULB TIP 10FT TU (MISCELLANEOUS) ×2 IMPLANT

## 2018-09-16 NOTE — Transfer of Care (Signed)
Immediate Anesthesia Transfer of Care Note  Patient: Jeffery Anthony  Procedure(s) Performed: TOTAL KNEE ARTHROPLASTY (Left Knee)  Patient Location: PACU  Anesthesia Type:Spinal  Level of Consciousness: awake and alert   Airway & Oxygen Therapy: Patient Spontanous Breathing and Patient connected to nasal cannula oxygen  Post-op Assessment: Report given to RN and Post -op Vital signs reviewed and stable  Post vital signs: Reviewed and stable  Last Vitals:  Vitals Value Taken Time  BP    Temp    Pulse 65 09/16/2018  9:13 AM  Resp 17 09/16/2018  9:13 AM  SpO2 93 % 09/16/2018  9:13 AM  Vitals shown include unvalidated device data.  Last Pain:  Vitals:   09/16/18 0642  TempSrc:   PainSc: 0-No pain         Complications: No apparent anesthesia complications

## 2018-09-16 NOTE — Anesthesia Procedure Notes (Signed)
Date/Time: 09/16/2018 7:18 AM Performed by: Sharlette Dense, CRNA Oxygen Delivery Method: Simple face mask

## 2018-09-16 NOTE — Discharge Instructions (Signed)

## 2018-09-16 NOTE — Anesthesia Postprocedure Evaluation (Signed)
Anesthesia Post Note  Patient: Taner Rzepka  Procedure(s) Performed: TOTAL KNEE ARTHROPLASTY (Left Knee)     Patient location during evaluation: Nursing Unit Anesthesia Type: Spinal Level of consciousness: awake, oriented and awake and alert Pain management: pain level controlled Vital Signs Assessment: post-procedure vital signs reviewed and stable Respiratory status: spontaneous breathing, nonlabored ventilation and respiratory function stable Cardiovascular status: blood pressure returned to baseline Anesthetic complications: no    Last Vitals:  Vitals:   09/16/18 1244 09/16/18 1511  BP: 132/79 130/83  Pulse: 62 84  Resp: 16 14  Temp: 36.6 C 36.9 C  SpO2: 97% 99%    Last Pain:  Vitals:   09/16/18 1706  TempSrc:   PainSc: 5                  Genna Casimir COKER

## 2018-09-16 NOTE — Evaluation (Signed)
Physical Therapy Evaluation Patient Details Name: Jeffery Anthony MRN: [id number] DOB: 05-07-1951 Today's Date: 09/16/2018   History of Present Illness  Pt s/p L TKR  Clinical Impression  Pt s/p L TKR and presents with decreased L LE strength/ROM and post op pain limiting functional mobility.  Pt should progress to dc home with family assist.    Follow Up Recommendations Home health PT;Follow surgeon's recommendation for DC plan and follow-up therapies    Equipment Recommendations  None recommended by PT    Recommendations for Other Services       Precautions / Restrictions Precautions Precautions: Knee;Fall Restrictions Weight Bearing Restrictions: No Other Position/Activity Restrictions: WBAT      Mobility  Bed Mobility Overal bed mobility: Needs Assistance Bed Mobility: Supine to Sit     Supine to sit: Min assist     General bed mobility comments: cues for sequence and use of R LE to self assist  Transfers Overall transfer level: Needs assistance Equipment used: Rolling walker (2 wheeled) Transfers: Sit to/from Stand Sit to Stand: Min assist         General transfer comment: cues for LE management and use of UEs to self assist  Ambulation/Gait Ambulation/Gait assistance: Min assist Gait Distance (Feet): 48 Feet Assistive device: Rolling walker (2 wheeled) Gait Pattern/deviations: Step-to pattern;Decreased step length - right;Decreased step length - left;Shuffle;Trunk flexed Gait velocity: decr   General Gait Details: cues for sequence, posture and position from [employer]            Wheelchair Mobility    Modified Rankin (Stroke Patients Only)       Balance Overall balance assessment: Mild deficits observed, not formally tested                                           Pertinent Vitals/Pain Pain Assessment: 0-10 Pain Score: 4  Pain Location: L knee Pain Descriptors / Indicators: Aching;Sore Pain Intervention(s):  Limited activity within patient's tolerance;Monitored during session;Premedicated before session;Ice applied    Home Living Family/patient expects to be discharged to:: Private residence Living Arrangements: Spouse/significant other Available Help at Discharge: Family Type of Home: House Home Access: Stairs to enter Entrance Stairs-Rails: None Entrance Stairs-Number of Steps: 3 Home Layout: Able to live on main level with bedroom/bathroom Home Equipment: Walker - 2 wheels;Bedside commode      Prior Function Level of Independence: Independent               Hand Dominance        Extremity/Trunk Assessment   Upper Extremity Assessment Upper Extremity Assessment: Overall WFL for tasks assessed    Lower Extremity Assessment Lower Extremity Assessment: LLE deficits/detail    Cervical / Trunk Assessment Cervical / Trunk Assessment: Normal  Communication   Communication: No difficulties  Cognition Arousal/Alertness: Awake/alert Behavior During Therapy: WFL for tasks assessed/performed Overall Cognitive Status: Within Functional Limits for tasks assessed                                        General Comments      Exercises Total Joint Exercises Ankle Circles/Pumps: AROM;Both;20 reps;Supine   Assessment/Plan    PT Assessment    PT Problem List         PT Treatment Interventions  PT Goals (Current goals can be found in the Care Plan section)  Acute Rehab PT Goals Patient Stated Goal: Regain IND PT Goal Formulation: With patient Time For Goal Achievement: 09/23/18 Potential to Achieve Goals: Good    Frequency     Barriers to discharge        Co-evaluation               AM-PAC PT "6 Clicks" Daily Activity  Outcome Measure Difficulty turning over in bed (including adjusting bedclothes, sheets and blankets)?: Unable Difficulty moving from lying on back to sitting on the side of the bed? : Unable Difficulty sitting down on  and standing up from a chair with arms (e.g., wheelchair, bedside commode, etc,.)?: Unable Help needed moving to and from a bed to chair (including a wheelchair)?: A Little Help needed walking in hospital room?: A Little Help needed climbing 3-5 steps with a railing? : A Little 6 Click Score: 12    End of Session Equipment Utilized During Treatment: Gait belt;Left knee immobilizer Activity Tolerance: Patient tolerated treatment well Patient left: in chair;with call bell/phone within reach;with family/visitor present Nurse Communication: Mobility status PT Visit Diagnosis: Difficulty in walking, not elsewhere classified (R26.2)    Time: 7672-0947 PT Time Calculation (min) (ACUTE ONLY): 25 min   Charges:   PT Evaluation $PT Eval Low Complexity: 1 Low PT Treatments $Gait Training: 8-22 mins        [place] Pager [phone] Office [phone]   Jeffery Anthony 09/16/2018, 4:16 PM

## 2018-09-16 NOTE — Anesthesia Preprocedure Evaluation (Signed)
Anesthesia Evaluation  Patient identified by MRN, date of birth, ID band Patient awake    Reviewed: Allergy & Precautions, NPO status , Patient's Chart, lab work & pertinent test results  Airway Mallampati: II  TM Distance: >3 FB Neck ROM: Full    Dental  (+) Teeth Intact, Dental Advisory Given   Pulmonary    breath sounds clear to auscultation       Cardiovascular  Rhythm:Regular Rate:Normal     Neuro/Psych    GI/Hepatic   Endo/Other    Renal/GU      Musculoskeletal   Abdominal   Peds  Hematology   Anesthesia Other Findings   Reproductive/Obstetrics                             Anesthesia Physical Anesthesia Plan  ASA: II  Anesthesia Plan: Spinal   Post-op Pain Management:  Regional for Post-op pain   Induction: Intravenous  PONV Risk Score and Plan: Ondansetron and Dexamethasone  Airway Management Planned: Natural Airway and Simple Face Mask  Additional Equipment:   Intra-op Plan:   Post-operative Plan:   Informed Consent: I have reviewed the patients History and Physical, chart, labs and discussed the procedure including the risks, benefits and alternatives for the proposed anesthesia with the patient or authorized representative who has indicated his/her understanding and acceptance.   Dental advisory given  Plan Discussed with: CRNA and Anesthesiologist  Anesthesia Plan Comments:         Anesthesia Quick Evaluation

## 2018-09-16 NOTE — Interval H&P Note (Signed)
History and Physical Interval Note:  09/16/2018 7:16 AM  Jeffery Anthony  has presented today for surgery, with the diagnosis of LEF KNEE OSTEOARTHRITIS  The various methods of treatment have been discussed with the patient and family. After consideration of risks, benefits and other options for treatment, the patient has consented to  Procedure(s): TOTAL KNEE ARTHROPLASTY (Left) as a surgical intervention .  The patient's history has been reviewed, patient examined, no change in status, stable for surgery.  I have reviewed the patient's chart and labs.  Questions were answered to the patient's satisfaction.     Alta Corning

## 2018-09-16 NOTE — Brief Op Note (Signed)
09/16/2018  9:46 AM  PATIENT:  Jeffery Anthony  67 y.o. male  PRE-OPERATIVE DIAGNOSIS:  LEFT KNEE OSTEOARTHRITIS  POST-OPERATIVE DIAGNOSIS:  LEFT KNEE OSTEOARTHRITIS  PROCEDURE:  Procedure(s): TOTAL KNEE ARTHROPLASTY (Left)  SURGEON:  Surgeon(s) and Role:    Dorna Leitz, MD - Primary  PHYSICIAN ASSISTANT:   ASSISTANTS: bethune   ANESTHESIA:   spinal  EBL:  75 mL   BLOOD ADMINISTERED:none  DRAINS: none   LOCAL MEDICATIONS USED:  MARCAINE    and OTHER experel  SPECIMEN:  No Specimen  DISPOSITION OF SPECIMEN:  N/A  COUNTS:  YES  TOURNIQUET:   Total Tourniquet Time Documented: Thigh (Left) - 58 minutes Total: Thigh (Left) - 58 minutes   DICTATION: .Other Dictation: Dictation Number [id number]  PLAN OF CARE: Admit for overnight observation  PATIENT DISPOSITION:  PACU - hemodynamically stable.   Delay start of Pharmacological VTE agent (>24hrs) due to surgical blood loss or risk of bleeding: no

## 2018-09-16 NOTE — Anesthesia Procedure Notes (Signed)
Anesthesia Regional Block: Adductor canal block   Pre-Anesthetic Checklist: ,, timeout performed, Correct Patient, Correct Site, Correct Laterality, Correct Procedure, Correct Position, site marked, Risks and benefits discussed, pre-op evaluation,  At surgeon's request and post-op pain management  Laterality: Left  Prep: Maximum Sterile Barrier Precautions used, chloraprep       Needles:  Injection technique: Single-shot  Needle Type: Echogenic Stimulator Needle     Needle Length: 9cm  Needle Gauge: 21     Additional Needles:   Procedures:,,,, ultrasound used (permanent image in chart),,,,  Narrative:  Start time: 09/16/2018 6:55 AM End time: 09/16/2018 7:05 AM Injection made incrementally with aspirations every 5 mL.  Performed by: Personally  Anesthesiologist: Roberts Gaudy, MD  Additional Notes: 20 cc 0.75% Ropivacaine injected easily

## 2018-09-16 NOTE — Anesthesia Procedure Notes (Signed)
Spinal  Patient location during procedure: OR Start time: 09/16/2018 7:20 AM End time: 09/16/2018 7:23 AM Staffing Resident/CRNA: Sharlette Dense, CRNA Performed: resident/CRNA  Preanesthetic Checklist Completed: patient identified, site marked, surgical consent, pre-op evaluation, timeout performed, IV checked, risks and benefits discussed and monitors and equipment checked Spinal Block Patient position: sitting Prep: DuraPrep Patient monitoring: heart rate, continuous pulse ox and blood pressure Approach: midline Location: L3-4 Injection technique: single-shot Needle Needle type: Sprotte  Needle gauge: 24 G Needle length: 9 cm Additional Notes Kit expiration date 08/26/2019 and lot # [id number] Clear free flow of CSF, negative heme, negative paresthesia Tolerated well and returned to supine position

## 2018-09-16 NOTE — Op Note (Signed)
NAME: Jeffery Anthony, Jeffery Anthony MEDICAL RECORD DD:[id number] ACCOUNT [id number] DATE OF BIRTH:1951-07-21 FACILITY: [facility] LOCATION: [facility] PHYSICIAN:Ignatius Kloos Maudie Mercury, MD  OPERATIVE REPORT  DATE OF PROCEDURE:  09/16/2018  PREOPERATIVE DIAGNOSIS:  End-stage degenerative joint disease, left knee.  POSTOPERATIVE DIAGNOSIS:  End-stage degenerative joint disease, left knee.  PROCEDURE:  Left total knee replacement with an Attune system, size 6 femur, size 7 tibia, 5 mm bridging bearing, and a 41 mm all polyethylene patella.  SURGEON:  Dorna Leitz, MD  ASSIST:  Gaspar Skeeters PA-C, who was present for the entire case and assisted by retraction, bone cuts, and closing to minimize OR time.  BRIEF HISTORY:  The patient is a 67 year old male with a long history significant complaints of left knee pain.  He had been treated conservatively for a prolonged period of time.  After failure of all conservative care, he is taken to the operating room  for left total knee replacement.  He had bone-on-bone change on x-ray and was having night pain and light activity pain.  DESCRIPTION OF PROCEDURE:  The patient was taken to the operating room after adequate anesthesia was obtained with a spinal anesthetic, the patient was placed supine on the operating table.  Left leg was prepped and draped in usual sterile fashion.   Following this, the leg was exsanguinated, blood pressure tourniquet inflated to 300 mmHg.  Midline incision was made after routine prep and drape and dissection was carried down to the extensor mechanism and a medial parapatellar arthrotomy was  undertaken.  Following this, the medial and lateral meniscus were removed, retropatellar fat pad, synovium in the anterior aspect of the femur, and the anterior and posterior cruciates.  Once this was done, the attention was turned to the femur where an  intramedullary pilot hole was drilled and a 4 degree valgus inclination cut was made with 9 mm of distal bone  resected.  Following this, the femur was sized to a 6.  Anterior and posterior cuts were made chamfers and box.  Attention was then turned to  the tibia.  It was cut perpendicular to its long axis and it sized to a 7 and drilled and keeled.  Trials were put in place with a 5 and excellent range of motion and stability were achieved.  The patella was cut down to the level of 14 mm.  The 41  paddle was chosen and lugs were drilled.  The knee put through a range of motion.  Excellent range of motion and stability were achieved.  At this point, all trial components were removed.  The knee was copiously and thoroughly lavaged with pulsatile  lavage irrigation.  Exparel was used throughout the capsular reflection at this point for postoperative pain control.  The final components were then cemented into place, size 7 tibia, size 6 femur, 5 mm bridging bearing trial was placed and a 41 all  poly patella was placed and held with a clamp.  All excess bone cement was removed.  The cement was allowed to completely harden.  When that is done, the knee was put through a range of motion and the final poly was placed and excellent stability and  range of motion are achieved at that point.  The medial parapatellar approach arthrotomy was closed with #1 Vicryl running.  The skin was closed with 0 and 2-0 Vicryl and 3-0 Monocryl subcuticular.  Benzoin and Steri-Strips were applied.  Sterile  compressive dressing was applied.  The patient was taken to recovery was noted to  be in satisfactory condition.  Estimated blood loss for the procedure is minimal.  TN/NUANCE  D:09/16/2018 T:09/16/2018 JOB:[id number]

## 2018-09-17 DIAGNOSIS — M1712 Unilateral primary osteoarthritis, left knee: Secondary | ICD-10-CM | POA: Diagnosis not present

## 2018-09-17 LAB — CBC
HCT: 35.7 % — ABNORMAL LOW (ref 39.0–52.0)
Hemoglobin: 12.4 g/dL — ABNORMAL LOW (ref 13.0–17.0)
MCH: 41.5 pg — ABNORMAL HIGH (ref 26.0–34.0)
MCHC: 34.7 g/dL (ref 30.0–36.0)
MCV: 119.4 fL — ABNORMAL HIGH (ref 80.0–100.0)
Platelets: 248 10*3/uL (ref 150–400)
RBC: 2.99 MIL/uL — ABNORMAL LOW (ref 4.22–5.81)
RDW: 15.6 % — ABNORMAL HIGH (ref 11.5–15.5)
WBC: 18.9 10*3/uL — ABNORMAL HIGH (ref 4.0–10.5)
nRBC: 0 % (ref 0.0–0.2)

## 2018-09-17 MED ORDER — ONDANSETRON HCL 4 MG PO TABS: 4.0000 mg | ORAL_TABLET | Freq: Four times a day (QID) | ORAL | 0 refills | Status: DC | PRN

## 2018-09-17 NOTE — Discharge Summary (Signed)
Physician Discharge Summary  Patient ID: Jeffery Anthony MRN: [id number] DOB/AGE: [birth year] 67 y.o.  Admit date: 09/16/2018 Discharge date: 09/17/2018  Admission Diagnoses:  Primary osteoarthritis of left knee  Discharge Diagnoses:  Principal Problem:   Primary osteoarthritis of left knee   Past Medical History:  Diagnosis Date  . Arthritis    knees  . GERD (gastroesophageal reflux disease)   . Headache    migraines    Surgeries: Procedure(s): TOTAL KNEE ARTHROPLASTY on 09/16/2018   Consultants (if any):   Discharged Condition: Improved  Hospital Course: Jeffery Anthony is an 67 y.o. male who was admitted 09/16/2018 with a diagnosis of Primary osteoarthritis of left knee and went to the operating room on 09/16/2018 and underwent the above named procedures.    He was given perioperative antibiotics:  Anti-infectives (From admission, onward)   Start     Dose/Rate Route Frequency Ordered Stop   09/16/18 1330  ceFAZolin (ANCEF) IVPB 2g/100 mL premix     2 g 200 mL/hr over 30 Minutes Intravenous Every 6 hours 09/16/18 1028 09/16/18 2036   09/16/18 0600  ceFAZolin (ANCEF) IVPB 2g/100 mL premix     2 g 200 mL/hr over 30 Minutes Intravenous On call to O.R. 09/16/18 6301 09/16/18 0740    .  He was given sequential compression devices, early ambulation, and ASA for DVT prophylaxis.  He benefited maximally from the hospital stay and there were no complications.    On POD1, he was tol PO, moving well with therapy, NVI and with externally clean dressing  Recent vital signs:  Vitals:   09/17/18 0526 09/17/18 0825  BP: 132/77   Pulse: 66   Resp: 16   Temp: 98.1 F (36.7 C)   SpO2: 97% 99%    Recent laboratory studies:  Lab Results  Component Value Date   HGB 12.4 (L) 09/17/2018   HGB 14.4 09/09/2018   HGB 14.9 04/05/2018   Lab Results  Component Value Date   WBC 18.9 (H) 09/17/2018   PLT 248 09/17/2018   Lab Results  Component Value Date   INR 0.94  09/09/2018   Lab Results  Component Value Date   NA 144 09/09/2018   K 4.5 09/09/2018   CL 109 09/09/2018   CO2 28 09/09/2018   BUN 16 09/09/2018   CREATININE 1.05 09/09/2018   GLUCOSE 104 (H) 09/09/2018    Discharge Medications:   Allergies as of 09/17/2018      Reactions   Ivp Dye [iodinated Diagnostic Agents] Nausea And Vomiting   iodine dye      Medication List    STOP taking these medications   amoxicillin-clavulanate 875-125 MG tablet Commonly known as:  AUGMENTIN   ciprofloxacin 500 MG tablet Commonly known as:  CIPRO   ibuprofen 200 MG tablet Commonly known as:  ADVIL,MOTRIN   meclizine 25 MG tablet Commonly known as:  ANTIVERT   SUMAtriptan 50 MG tablet Commonly known as:  IMITREX     TAKE these medications   aspirin EC 325 MG tablet Take 1 tablet (325 mg total) by mouth 2 (two) times daily after a meal. Take x 1 month post op to decrease risk of blood clots.   butalbital-acetaminophen-caffeine 50-325-40 MG tablet Commonly known as:  FIORICET, ESGIC TAKE 1 TO 2 TABLETS TWICE A DAY AS NEEDED FOR HEADACHE What changed:  See the new instructions.   celecoxib 200 MG capsule Commonly known as:  CELEBREX One to 2 tablets by mouth daily as needed for pain.  What changed:    how much to take  how to take this  when to take this  reasons to take this   docusate sodium 100 MG capsule Commonly known as:  COLACE Take 1 capsule (100 mg total) by mouth 2 (two) times daily.   EXCEDRIN EXTRA STRENGTH 250-250-65 MG tablet Generic drug:  aspirin-acetaminophen-caffeine Take 1 tablet by mouth every 6 (six) hours as needed for migraine.   gabapentin 300 MG capsule Commonly known as:  NEURONTIN Take 1 capsule (300 mg total) by mouth 2 (two) times daily for 14 days.   ondansetron 4 MG tablet Commonly known as:  ZOFRAN Take 1 tablet (4 mg total) by mouth every 6 (six) hours as needed for nausea.   oxyCODONE-acetaminophen 5-325 MG tablet Commonly known as:   PERCOCET/ROXICET Take 1-2 tablets by mouth every 6 (six) hours as needed for severe pain.   Pseudoephedrine HCl (Deter) 30 MG Taba Take 60 mg by mouth every 6 (six) hours as needed (nasal congestion).   timolol 0.5 % ophthalmic solution Commonly known as:  TIMOPTIC Place 1 drop into both eyes 2 (two) times daily as needed (headache/eye pressure).   tiZANidine 2 MG tablet Commonly known as:  ZANAFLEX Take 1 tablet (2 mg total) by mouth every 8 (eight) hours as needed for muscle spasms.   VICKS NYQUIL SINUS PO Take 30 mLs by mouth at bedtime.       Diagnostic Studies: Dg Chest 2 View  Result Date: 09/09/2018 CLINICAL DATA:  Preoperative evaluation EXAM: CHEST - 2 VIEW COMPARISON:  Chest radiograph 06/22/2016 FINDINGS: Normal cardiac and mediastinal contours. No consolidative pulmonary opacities. No pleural effusion or pneumothorax. Regional skeleton is unremarkable. IMPRESSION: No acute cardiopulmonary process. Electronically Signed   By: Lovey Newcomer M.D.   On: 09/09/2018 10:11    Disposition: Discharge disposition: 01-Home or Self Care         Follow-up Information    Dorna Leitz, MD. Schedule an appointment as soon as possible for a visit in 2 weeks.   Specialty:  Orthopedic Surgery Contact information: [place] [address] [phone]            Signed: Jolyn Nap 09/17/2018, 8:45 AM

## 2018-09-17 NOTE — Progress Notes (Signed)
Physical Therapy Treatment Patient Details Name: Jeffery Anthony MRN: [id number] DOB: [birth year] Today's Date: 09/17/2018    History of Present Illness Pt s/p L TKR    PT Comments    Pt progressing well with mobility but ltd by nausea with activity.[place] Pager [phone] Office [phone]   Follow Up Recommendations  Home health PT;Follow surgeon's recommendation for DC plan and follow-up therapies     Equipment Recommendations  None recommended by PT    Recommendations for Other Services       Precautions / Restrictions Precautions Precautions: Knee;Fall Restrictions Weight Bearing Restrictions: No Other Position/Activity Restrictions: WBAT    Mobility  Bed Mobility Overal bed mobility: Needs Assistance Bed Mobility: Sit to Supine       Sit to supine: Min guard   General bed mobility comments: cues for sequence and use of R LE to self assist  Transfers Overall transfer level: Needs assistance Equipment used: Rolling walker (2 wheeled) Transfers: Sit to/from Stand Sit to Stand: Min guard         General transfer comment: cues for LE management and use of UEs to self assist  Ambulation/Gait Ambulation/Gait assistance: Min guard Gait Distance (Feet): 200 Feet Assistive device: Rolling walker (2 wheeled) Gait Pattern/deviations: Step-to pattern;Step-through pattern;Decreased step length - right;Decreased step length - left;Shuffle;Trunk flexed Gait velocity: decr   General Gait Details: cues for sequence, posture and position from [employer]             Wheelchair Mobility    Modified Rankin (Stroke Patients Only)       Balance Overall balance assessment: Mild deficits observed, not formally tested                                          Cognition Arousal/Alertness: Awake/alert Behavior During Therapy: WFL for tasks assessed/performed Overall Cognitive Status: Within  Functional Limits for tasks assessed                                        Exercises Total Joint Exercises Ankle Circles/Pumps: AROM;Both;20 reps;Supine Quad Sets: AROM;Both;15 reps;Supine Heel Slides: AAROM;Left;15 reps;Supine Straight Leg Raises: AAROM;AROM;Left;10 reps;Supine Knee Flexion: AAROM;Left;5 reps;Seated    General Comments        Pertinent Vitals/Pain Pain Assessment: 0-10 Pain Score: 5  Pain Location: L knee Pain Descriptors / Indicators: Aching;Sore Pain Intervention(s): Limited activity within patient's tolerance;Monitored during session;Premedicated before session;Ice applied    Home Living                      Prior Function            PT Goals (current goals can now be found in the care plan section) Acute Rehab PT Goals Patient Stated Goal: Regain IND PT Goal Formulation: With patient Time For Goal Achievement: 09/23/18 Potential to Achieve Goals: Good Progress towards PT goals: Progressing toward goals    Frequency    7X/week      PT Plan Current plan remains appropriate    Co-evaluation              AM-PAC PT "6 Clicks" Mobility   Outcome Measure  Help needed turning from your back to your side while in a flat bed without using bedrails?:  A Little Help needed moving from lying on your back to sitting on the side of a flat bed without using bedrails?: A Little Help needed moving to and from a bed to a chair (including a wheelchair)?: A Little Help needed standing up from a chair using your arms (e.g., wheelchair or bedside chair)?: A Little Help needed to walk in hospital room?: A Little Help needed climbing 3-5 steps with a railing? : A Little 6 Click Score: 18    End of Session Equipment Utilized During Treatment: Gait belt;Left knee immobilizer Activity Tolerance: Patient tolerated treatment well Patient left: with call bell/phone within reach;in bed Nurse Communication: Mobility status PT Visit  Diagnosis: Difficulty in walking, not elsewhere classified (R26.2)     Time: 5993-5701 PT Time Calculation (min) (ACUTE ONLY): 45 min  Charges:  $Gait Training: 8-22 mins $Therapeutic Exercise: 23-37 mins                     [place] Pager [phone] Office [phone]    Alano Blasco 09/17/2018, 9:30 AM

## 2018-09-17 NOTE — Progress Notes (Signed)
Physical Therapy Treatment Patient Details Name: Jeffery Anthony MRN: [id number] DOB: Feb 29, 1951 Today's Date: 09/17/2018    History of Present Illness Pt s/p L TKR    PT Comments    Pt continues motivated and progressing well with mobility.  Spouse present to review stairs, home therex, ice packs.  Written instruction for stairs and therex provided and RW adjusted for home.   Follow Up Recommendations  Home health PT;Follow surgeon's recommendation for DC plan and follow-up therapies     Equipment Recommendations  None recommended by PT    Recommendations for Other Services       Precautions / Restrictions Precautions Precautions: Knee;Fall Restrictions Weight Bearing Restrictions: No Other Position/Activity Restrictions: WBAT    Mobility  Bed Mobility Overal bed mobility: Needs Assistance Bed Mobility: Supine to Sit     Supine to sit: Supervision        Transfers Overall transfer level: Needs assistance Equipment used: Rolling walker (2 wheeled) Transfers: Sit to/from Stand Sit to Stand: Supervision         General transfer comment: cues for LE management and use of UEs to self assist  Ambulation/Gait Ambulation/Gait assistance: Min guard;Supervision Gait Distance (Feet): 200 Feet Assistive device: Rolling walker (2 wheeled) Gait Pattern/deviations: Step-to pattern;Step-through pattern;Decreased step length - right;Decreased step length - left;Shuffle;Trunk flexed Gait velocity: decr   General Gait Details: cues for sequence, posture and position from RW   Stairs Stairs: Yes Stairs assistance: Min assist Stair Management: No rails;Step to pattern;Backwards;With walker Number of Stairs: 4 General stair comments: 2 stairs twice with RW bckwd - spouse assisting on second attempt.  Written instruction provided   Wheelchair Mobility    Modified Rankin (Stroke Patients Only)       Balance Overall balance assessment: Mild deficits observed, not  formally tested                                          Cognition Arousal/Alertness: Awake/alert Behavior During Therapy: WFL for tasks assessed/performed Overall Cognitive Status: Within Functional Limits for tasks assessed                                        Exercises Total Joint Exercises Ankle Circles/Pumps: AROM;Both;20 reps;Supine Quad Sets: AROM;Both;15 reps;Supine Heel Slides: AAROM;Left;15 reps;Supine Straight Leg Raises: AAROM;AROM;Left;10 reps;Supine Knee Flexion: AAROM;Left;5 reps;Seated    General Comments        Pertinent Vitals/Pain Pain Assessment: 0-10 Pain Score: 5  Pain Location: L knee Pain Descriptors / Indicators: Aching;Sore Pain Intervention(s): Limited activity within patient's tolerance;Monitored during session;Premedicated before session;Ice applied    Home Living                      Prior Function            PT Goals (current goals can now be found in the care plan section) Acute Rehab PT Goals Patient Stated Goal: Regain IND PT Goal Formulation: With patient Time For Goal Achievement: 09/23/18 Potential to Achieve Goals: Good Progress towards PT goals: Progressing toward goals    Frequency    7X/week      PT Plan Current plan remains appropriate    Co-evaluation              AM-PAC PT "6 Clicks" Mobility  Outcome Measure  Help needed turning from your back to your side while in a flat bed without using bedrails?: None Help needed moving from lying on your back to sitting on the side of a flat bed without using bedrails?: None Help needed moving to and from a bed to a chair (including a wheelchair)?: None Help needed standing up from a chair using your arms (e.g., wheelchair or bedside chair)?: A Little Help needed to walk in hospital room?: A Little Help needed climbing 3-5 steps with a railing? : A Little 6 Click Score: 21    End of Session Equipment Utilized During  Treatment: Gait belt Activity Tolerance: Patient tolerated treatment well Patient left: in chair;with call bell/phone within reach;with family/visitor present Nurse Communication: Mobility status PT Visit Diagnosis: Difficulty in walking, not elsewhere classified (R26.2)     Time: 3524-8185 PT Time Calculation (min) (ACUTE ONLY): 49 min  Charges:  $Gait Training: 8-22 mins $Therapeutic Exercise: 8-22 mins $Therapeutic Activity: 8-22 mins                     Debe Coder PT Acute Rehabilitation Services Pager [phone] Office [phone]    Izzak Fries 09/17/2018, 3:12 PM

## 2018-09-17 NOTE — Care Management Note (Signed)
Case Management Note  Patient Details  Name: Jeffery Anthony MRN: [id number] Date of Birth: [birth year]  Subjective/Objective: 67 yo M s/p L TKR                Action/Plan: Received referral to assist with [facility] and DME   Expected Discharge Date:  09/17/18               Expected Discharge Plan:  [place]  In-House Referral:     Discharge planning Services  CM Consult  Post Acute Care Choice:  Home Health Choice offered to:  Patient, Spouse  DME Arranged:    DME Agency:     HH Arranged:  PT [place]:   [facility]  Status of Service:  Completed, signed off  If discussed at [employer], dates discussed:    Additional Comments: Met with pt and wife. He plans to return home with the support of his wife. He has DME. Discussed preference for a [facility] agency. Referral made to [facility] by the physician's office and they agreed with agency.   Norina Buzzard, RN 09/17/2018, 1:44 PM

## 2018-09-17 NOTE — Progress Notes (Signed)
Anesthesiology follow-up:  67 year old male status post left total knee replacement yesterday.  He received an adductor canal block preoperatively with 20 cc of 0.75% ropivacaine.  He is presently comfortable and has had 1 dose of oxycodone at 5:15 AM.  He has walked with physical therapy without difficulty and will be discharged this morning.  Roberts Gaudy, MD

## 2018-09-17 NOTE — Progress Notes (Signed)
Pt and pt's Wife were provided with d/c instructions and prescriptions. After discussing the pt's plan of care upon d/c home, the pt and pt's Wife reported no further questions or concerns.

## 2018-09-17 NOTE — Addendum Note (Signed)
Addendum  created 09/17/18 8719 by Roberts Gaudy, MD   Sign clinical note

## 2018-09-19 ENCOUNTER — Encounter (HOSPITAL_COMMUNITY): Payer: Self-pay | Admitting: Orthopedic Surgery

## 2018-10-27 ENCOUNTER — Encounter: Payer: Self-pay | Admitting: Family Medicine

## 2018-10-27 ENCOUNTER — Ambulatory Visit (INDEPENDENT_AMBULATORY_CARE_PROVIDER_SITE_OTHER): Payer: Commercial Managed Care - PPO | Admitting: Family Medicine

## 2018-10-27 VITALS — BP 117/71 | HR 74 | Ht 71.0 in | Wt 222.0 lb

## 2018-10-27 DIAGNOSIS — S39012A Strain of muscle, fascia and tendon of lower back, initial encounter: Secondary | ICD-10-CM

## 2018-10-27 DIAGNOSIS — J0101 Acute recurrent maxillary sinusitis: Secondary | ICD-10-CM | POA: Diagnosis not present

## 2018-10-27 MED ORDER — BENZONATATE 200 MG PO CAPS: 200.0000 mg | ORAL_CAPSULE | Freq: Three times a day (TID) | ORAL | 0 refills | Status: DC | PRN

## 2018-10-27 MED ORDER — IPRATROPIUM BROMIDE 0.06 % NA SOLN: 2.0000 | NASAL | 6 refills | Status: DC | PRN

## 2018-10-27 MED ORDER — PREDNISONE 10 MG PO TABS: 30.0000 mg | ORAL_TABLET | Freq: Every day | ORAL | 0 refills | Status: DC

## 2018-10-27 MED ORDER — CEFDINIR 300 MG PO CAPS: 300.0000 mg | ORAL_CAPSULE | Freq: Two times a day (BID) | ORAL | 0 refills | Status: DC

## 2018-10-27 NOTE — Patient Instructions (Signed)
Thank you for coming in today. Take omnicef antibiotic twice daily for 1 week  Continue over the counter medicine.  Use the nasal spray  Use the cough pills as needed.  Ok to take 1/2 oxycodone pill at bedtime as needed for cough.   For back add physical therapy  Use heating pad.   TENS UNIT: This is helpful for muscle pain and spasm.   Search and Purchase a TENS 7000 2nd edition at  www.tenspros.com or www.[employer].com It should be less than $30.     TENS unit instructions: Do not shower or bathe with the unit on Turn the unit off before removing electrodes or batteries If the electrodes lose stickiness add a drop of water to the electrodes after they are disconnected from the unit and place on plastic sheet. If you continued to have difficulty, call the TENS unit company to purchase more electrodes. Do not apply lotion on the skin area prior to use. Make sure the skin is clean and dry as this will help prolong the life of the electrodes. After use, always check skin for unusual red areas, rash or other skin difficulties. If there are any skin problems, does not apply electrodes to the same area. Never remove the electrodes from the unit by pulling the wires. Do not use the TENS unit or electrodes other than as directed. Do not change electrode placement without consultating your therapist or physician. Keep 2 fingers with between each electrode. Wear time ratio is 2:1, on to off times.    For example on for 30 minutes off for 15 minutes and then on for 30 minutes off for 15 minutes    Lumbosacral Strain Lumbosacral strain is an injury that causes pain in the lower back (lumbosacral spine). This injury usually occurs from overstretching the muscles or ligaments along your spine. A strain can affect one or more muscles or cord-like tissues that connect bones to other bones (ligaments). What are the causes? This condition may be caused by:  A hard, direct hit (blow) to the  back.  Excessive stretching of the lower back muscles. This may result from: ? A fall. ? Lifting something heavy. ? Repetitive movements such as bending or crouching. What increases the risk? The following factors may increase your risk of getting this condition:  Participating in sports or activities that involve: ? A sudden twist of the back. ? Pushing or pulling motions.  Being overweight or obese.  Having poor strength and flexibility, especially tight hamstrings or weak muscles in the back or abdomen.  Having too much of a curve in the lower back.  Having a pelvis that is tilted forward. What are the signs or symptoms? The main symptom of this condition is pain in the lower back, at the site of the strain. Pain may extend (radiate) down one or both legs. How is this diagnosed? This condition is diagnosed based on:  Your symptoms.  Your medical history.  A physical exam. ? Your health care provider may push on certain areas of your back to determine the source of your pain. ? You may be asked to bend forward, backward, and side to side to assess the severity of your pain and your range of motion.  Imaging tests, such as: ? X-rays. ? MRI.  How is this treated? Treatment for this condition may include:  Putting heat and cold on the affected area.  Medicines to help relieve pain and relax your muscles (muscle relaxants).  NSAIDs to  help reduce swelling and discomfort. When your symptoms improve, it is important to gradually return to your normal routine as soon as possible to reduce pain, avoid stiffness, and avoid loss of muscle strength. Generally, symptoms should improve within 6 weeks of treatment. However, recovery time varies. Follow these instructions at home: Managing pain, stiffness, and swelling   If directed, put ice on the injured area during the first 24 hours after your strain. ? Put ice in a plastic bag. ? Place a towel between your skin and the  bag. ? Leave the ice on for 20 minutes, 2-3 times a day.  If directed, put heat on the affected area as often as told by your health care provider. Use the heat source that your health care provider recommends, such as a moist heat pack or a heating pad. ? Place a towel between your skin and the heat source. ? Leave the heat on for 20-30 minutes. ? Remove the heat if your skin turns bright red. This is especially important if you are unable to feel pain, heat, or cold. You may have a greater risk of getting burned. Activity  Rest and return to your normal activities as told by your health care provider. Ask your health care provider what activities are safe for you.  Avoid activities that take a lot of energy for as long as told by your health care provider. General instructions  Take over-the-counter and prescription medicines only as told by your health care provider.  Donot drive or use heavy machinery while taking prescription pain medicine.  Do not use any products that contain nicotine or tobacco, such as cigarettes and e-cigarettes. If you need help quitting, ask your health care provider.  Keep all follow-up visits as told by your health care provider. This is important. How is this prevented?  Use correct form when playing sports and lifting heavy objects.  Use good posture when sitting and standing.  Maintain a healthy weight.  Sleep on a mattress with medium firmness to support your back.  Be safe and responsible while being active to avoid falls.  Do at least 150 minutes of moderate-intensity exercise each week, such as brisk walking or water aerobics. Try a form of exercise that takes stress off your back, such as swimming or stationary cycling.  Maintain physical fitness, including: ? Strength. ? Flexibility. ? Cardiovascular fitness. ? Endurance. Contact a health care provider if:  Your back pain does not improve after 6 weeks of treatment.  Your symptoms  get worse. Get help right away if:  Your back pain is severe.  You cannot stand or walk.  You have difficulty controlling when you urinate or when you have a bowel movement.  You feel nauseous or you vomit.  Your feet get very cold.  You have numbness, tingling, weakness, or problems using your arms or legs.  You develop any of the following: ? Shortness of breath. ? Dizziness. ? Pain in your legs. ? Weakness in your buttocks or legs. ? Discoloration of the skin on your toes or legs. This information is not intended to replace advice given to you by your health care provider. Make sure you discuss any questions you have with your health care provider. Document Released: 07/22/2005 Document Revised: 05/01/2016 Document Reviewed: 03/15/2016 Elsevier Interactive Patient Education  2019 [employer]   Sinusitis, Adult Sinusitis is inflammation of your sinuses. Sinuses are hollow spaces in the bones around your face. Your sinuses are located:  Around your  eyes.  In the middle of your forehead.  Behind your nose.  In your cheekbones. Mucus normally drains out of your sinuses. When your nasal tissues become inflamed or swollen, mucus can become trapped or blocked. This allows bacteria, viruses, and fungi to grow, which leads to infection. Most infections of the sinuses are caused by a virus. Sinusitis can develop quickly. It can last for up to 4 weeks (acute) or for more than 12 weeks (chronic). Sinusitis often develops after a cold. What are the causes? This condition is caused by anything that creates swelling in the sinuses or stops mucus from draining. This includes:  Allergies.  Asthma.  Infection from bacteria or viruses.  Deformities or blockages in your nose or sinuses.  Abnormal growths in the nose (nasal polyps).  Pollutants, such as chemicals or irritants in the air.  Infection from fungi (rare). What increases the risk? You are more likely to develop this  condition if you:  Have a weak body defense system (immune system).  Do a lot of swimming or diving.  Overuse nasal sprays.  Smoke. What are the signs or symptoms? The main symptoms of this condition are pain and a feeling of pressure around the affected sinuses. Other symptoms include:  Stuffy nose or congestion.  Thick drainage from your nose.  Swelling and warmth over the affected sinuses.  Headache.  Upper toothache.  A cough that may get worse at night.  Extra mucus that collects in the throat or the back of the nose (postnasal drip).  Decreased sense of smell and taste.  Fatigue.  A fever.  Sore throat.  Bad breath. How is this diagnosed? This condition is diagnosed based on:  Your symptoms.  Your medical history.  A physical exam.  Tests to find out if your condition is acute or chronic. This may include: ? Checking your nose for nasal polyps. ? Viewing your sinuses using a device that has a light (endoscope). ? Testing for allergies or bacteria. ? Imaging tests, such as an MRI or CT scan. In rare cases, a bone biopsy may be done to rule out more serious types of fungal sinus disease. How is this treated? Treatment for sinusitis depends on the cause and whether your condition is chronic or acute.  If caused by a virus, your symptoms should go away on their own within 10 days. You may be given medicines to relieve symptoms. They include: ? Medicines that shrink swollen nasal passages (topical intranasal decongestants). ? Medicines that treat allergies (antihistamines). ? A spray that eases inflammation of the nostrils (topical intranasal corticosteroids). ? Rinses that help get rid of thick mucus in your nose (nasal saline washes).  If caused by bacteria, your health care provider may recommend waiting to see if your symptoms improve. Most bacterial infections will get better without antibiotic medicine. You may be given antibiotics if you have: ? A  severe infection. ? A weak immune system.  If caused by narrow nasal passages or nasal polyps, you may need to have surgery. Follow these instructions at home: Medicines  Take, use, or apply over-the-counter and prescription medicines only as told by your health care provider. These may include nasal sprays.  If you were prescribed an antibiotic medicine, take it as told by your health care provider. Do not stop taking the antibiotic even if you start to feel better. Hydrate and humidify   Drink enough fluid to keep your urine pale yellow. Staying hydrated will help to thin your  mucus.  Use a cool mist humidifier to keep the humidity level in your home above 50%.  Inhale steam for 10-15 minutes, 3-4 times a day, or as told by your health care provider. You can do this in the bathroom while a hot shower is running.  Limit your exposure to cool or dry air. Rest  Rest as much as possible.  Sleep with your head raised (elevated).  Make sure you get enough sleep each night. General instructions   Apply a warm, moist washcloth to your face 3-4 times a day or as told by your health care provider. This will help with discomfort.  Wash your hands often with soap and water to reduce your exposure to germs. If soap and water are not available, use hand sanitizer.  Do not smoke. Avoid being around people who are smoking (secondhand smoke).  Keep all follow-up visits as told by your health care provider. This is important. Contact a health care provider if:  You have a fever.  Your symptoms get worse.  Your symptoms do not improve within 10 days. Get help right away if:  You have a severe headache.  You have persistent vomiting.  You have severe pain or swelling around your face or eyes.  You have vision problems.  You develop confusion.  Your neck is stiff.  You have trouble breathing. Summary  Sinusitis is soreness and inflammation of your sinuses. Sinuses are hollow  spaces in the bones around your face.  This condition is caused by nasal tissues that become inflamed or swollen. The swelling traps or blocks the flow of mucus. This allows bacteria, viruses, and fungi to grow, which leads to infection.  If you were prescribed an antibiotic medicine, take it as told by your health care provider. Do not stop taking the antibiotic even if you start to feel better.  Keep all follow-up visits as told by your health care provider. This is important. This information is not intended to replace advice given to you by your health care provider. Make sure you discuss any questions you have with your health care provider. Document Released: 10/12/2005 Document Revised: 03/14/2018 Document Reviewed: 03/14/2018 Elsevier Interactive Patient Education  2019 [employer].

## 2018-10-27 NOTE — Progress Notes (Signed)
Jeffery Anthony is a 68 y.o. male who presents to [facility]: [facility] today for sinusitis, and back pain.  Jeffery Anthony has a about 1 week history of sinus pain and pressure associate with cough and congestion.  He denies significant wheezing or shortness of breath.  He is tried some over-the-counter medications including Excedrin and Benadryl which have helped a bit.  He notes symptoms are persisting and have slightly worsened.  He is developed some chills but denies significant fever.  Additionally he notes a 3-day history of left low back pain.  He denies significant radiating pain weakness or severe numbness.  He denies significant bowel bladder dysfunction.  He denies any injury.  He notes that he is about 6 weeks out from his left total knee replacement and has been doing physical therapy and thinks he may have injured his back pushing it a bit far in physical therapy.  Pain is worse with activity and better with rest.  Symptoms are moderate.  He is tried some over-the-counter medicines for pain which have helped a bit.  He notes that he no longer is taking oxycodone for knee pain but he does have some leftover.   ROS as above:  Exam:  BP 117/71   Pulse 74   Ht 5\' 11"  (1.803 m)   Wt 222 lb (100.7 kg)   BMI 30.96 kg/m  Wt Readings from Last 5 Encounters:  10/27/18 222 lb (100.7 kg)  09/16/18 235 lb 9.6 oz (106.9 kg)  09/09/18 235 lb 9.6 oz (106.9 kg)  05/17/18 227 lb (103 kg)  04/05/18 238 lb (108 kg)    Gen: Well NAD HEENT: EOMI,  MMM inflamed nasal turbinates bilaterally with mild nasal discharge.  Tender palpation maxillary sinuses bilaterally.  Normal posterior pharynx.  No cervical lymphadenopathy.  Normal tympanic membranes bilaterally Lungs: Normal work of breathing. CTABL Heart: RRR no MRG Abd: NABS, Soft. Nondistended, Nontender Exts: Brisk capillary refill, warm and  well perfused.  Well-appearing mature scar anterior left knee L-spine: Nontender to spinal midline.  Tender palpation left lumbar paraspinal musculature. Normal lumbar motion pain with left rotation and left lateral flexion. Lower extremity strength is intact throughout.   Lab and Radiology Results No results found for this or any previous visit (from the past 72 hour(s)). No results found.    Assessment and Plan: 68 y.o. male with sinusitis.  Likely initially viral with possible early second sickening.  Continue over-the-counter medications.  Treat with Tessalon Perles and Atrovent nasal spray symptomatically.  Use Omnicef for bacterial infection.  Backup printed prednisone prescription to use if patient worsens.  Recommend against taking prednisone right now if possible due to potential delayed healing from left total knee replacement.  Additionally patient has a left low back strain.  Plan to continue over-the-counter medications.  Plan to add back exercises to physical therapy currently receiving.  Recheck if not improving.  PDMP not reviewed this encounter. Orders Placed This Encounter  Procedures  . Ambulatory referral to Physical Therapy    Referral Priority:   Routine    Referral Type:   Physical Medicine    Referral Reason:   Specialty Services Required    Requested Specialty:   Physical Therapy    Number of Visits Requested:   1   Meds ordered this encounter  Medications  . cefdinir (OMNICEF) 300 MG capsule    Sig: Take 1 capsule (300 mg total) by mouth 2 (two) times  daily.    Dispense:  14 capsule    Refill:  0  . benzonatate (TESSALON) 200 MG capsule    Sig: Take 1 capsule (200 mg total) by mouth 3 (three) times daily as needed for cough.    Dispense:  45 capsule    Refill:  0  . predniSONE (DELTASONE) 10 MG tablet    Sig: Take 3 tablets (30 mg total) by mouth daily with breakfast.    Dispense:  15 tablet    Refill:  0  . ipratropium (ATROVENT) 0.06 % nasal spray      Sig: Place 2 sprays into both nostrils every 4 (four) hours as needed.    Dispense:  10 mL    Refill:  6     Historical information moved to improve visibility of documentation.  Past Medical History:  Diagnosis Date  . Arthritis    knees  . GERD (gastroesophageal reflux disease)   . Headache    migraines   Past Surgical History:  Procedure Laterality Date  . KNEE ARTHROSCOPY Bilateral 1992, 2008  . ruptured achilles tendon Left 1992  . TONSILLECTOMY AND ADENOIDECTOMY  1961  . 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 Smoking status: Never Smoker  . Smokeless tobacco: Never Used  Substance Use Topics  . Alcohol use: Yes    Alcohol/week: 1.0 standard drinks    Types: 1 Glasses of wine per week   family history includes Heart attack in his father; Hyperlipidemia in his father; Hypertension in his father.  Medications: Current Outpatient Medications  Medication Sig Dispense Refill  . aspirin EC 325 MG tablet Take 1 tablet (325 mg total) by mouth 2 (two) times daily after a meal. Take x 1 month post op to decrease risk of blood clots. 60 tablet 0  . aspirin-acetaminophen-caffeine (EXCEDRIN EXTRA STRENGTH) 250-250-65 MG tablet Take 1 tablet by mouth every 6 (six) hours as needed for migraine.    . butalbital-acetaminophen-caffeine (FIORICET, ESGIC) 50-325-40 MG tablet TAKE 1 TO 2 TABLETS TWICE A DAY AS NEEDED FOR HEADACHE (Patient taking differently: Take 1-2 tablets by mouth 2 (two) times daily as needed for headache. ) 14 tablet 0  . celecoxib (CELEBREX) 200 MG capsule One to 2 tablets by mouth daily as needed for pain. (Patient taking differently: Take 200 mg by mouth 2 (two) times daily as needed for moderate pain. One to 2 tablets by mouth daily as needed for pain.) 60 capsule 2  . docusate sodium (COLACE) 100 MG  capsule Take 1 capsule (100 mg total) by mouth 2 (two) times daily. 30 capsule 0  . Doxylamine-Phenylephrine-APAP (VICKS NYQUIL SINUS PO) Take 30 mLs by mouth at bedtime.    . ondansetron (ZOFRAN) 4 MG tablet Take 1 tablet (4 mg total) by mouth every 6 (six) hours as needed for nausea. 20 tablet 0  . oxyCODONE-acetaminophen (PERCOCET/ROXICET) 5-325 MG tablet Take 1-2 tablets by mouth every 6 (six) hours as needed for severe pain. 50 tablet 0  . Pseudoephedrine HCl, Deter, 30 MG TABA Take 60 mg by mouth every 6 (six) hours as needed (nasal congestion).    . timolol (TIMOPTIC) 0.5 % ophthalmic solution Place 1 drop into both eyes 2 (two) times daily as needed (headache/eye pressure).   3  . tiZANidine (ZANAFLEX) 2 MG tablet Take 1 tablet (2 mg  total) by mouth every 8 (eight) hours as needed for muscle spasms. 50 tablet 0  . benzonatate (TESSALON) 200 MG capsule Take 1 capsule (200 mg total) by mouth 3 (three) times daily as needed for cough. 45 capsule 0  . cefdinir (OMNICEF) 300 MG capsule Take 1 capsule (300 mg total) by mouth 2 (two) times daily. 14 capsule 0  . gabapentin (NEURONTIN) 300 MG capsule Take 1 capsule (300 mg total) by mouth 2 (two) times daily for 14 days. 28 capsule 0  . ipratropium (ATROVENT) 0.06 % nasal spray Place 2 sprays into both nostrils every 4 (four) hours as needed. 10 mL 6  . predniSONE (DELTASONE) 10 MG tablet Take 3 tablets (30 mg total) by mouth daily with breakfast. 15 tablet 0   No current facility-administered medications for this visit.    Allergies  Allergen Reactions  . Ivp Dye [Iodinated Diagnostic Agents] Nausea And Vomiting    iodine dye     Discussed warning signs or symptoms. Please see discharge instructions. Patient expresses understanding.

## 2018-12-14 ENCOUNTER — Encounter: Payer: Self-pay | Admitting: Internal Medicine

## 2018-12-20 ENCOUNTER — Other Ambulatory Visit: Payer: Self-pay | Admitting: Physician Assistant

## 2018-12-20 DIAGNOSIS — R51 Headache: Principal | ICD-10-CM

## 2018-12-20 DIAGNOSIS — R519 Headache, unspecified: Secondary | ICD-10-CM

## 2018-12-21 ENCOUNTER — Other Ambulatory Visit: Payer: Self-pay

## 2018-12-21 MED ORDER — SUMATRIPTAN SUCCINATE 50 MG PO TABS: 50.0000 mg | ORAL_TABLET | ORAL | 0 refills | Status: DC | PRN

## 2018-12-21 NOTE — Telephone Encounter (Signed)
Needs follow-up

## 2018-12-21 NOTE — Telephone Encounter (Signed)
Last RX sent 07/19/17 for #14 mp RF  RX pended, please review and advise if appropriate

## 2018-12-21 NOTE — Telephone Encounter (Signed)
Scheduled for 12/28/18 at 7 AM

## 2018-12-21 NOTE — Telephone Encounter (Signed)
Sent refills. He is due for follow up.

## 2018-12-21 NOTE — Telephone Encounter (Addendum)
Requesting RF on Imitrex 50 mg  Last RX written for this was 12/01/16, no longer on med list  Please review and see if appropriate. Thanks

## 2018-12-21 NOTE — Telephone Encounter (Signed)
Scheduled 12/28/18 at 7 AM

## 2018-12-28 ENCOUNTER — Encounter: Payer: Self-pay | Admitting: Physician Assistant

## 2018-12-28 ENCOUNTER — Ambulatory Visit (INDEPENDENT_AMBULATORY_CARE_PROVIDER_SITE_OTHER): Payer: Commercial Managed Care - PPO | Admitting: Physician Assistant

## 2018-12-28 VITALS — BP 111/72 | HR 75 | Temp 97.7°F | Wt 220.0 lb

## 2018-12-28 DIAGNOSIS — Z1211 Encounter for screening for malignant neoplasm of colon: Secondary | ICD-10-CM

## 2018-12-28 DIAGNOSIS — N529 Male erectile dysfunction, unspecified: Secondary | ICD-10-CM | POA: Diagnosis not present

## 2018-12-28 DIAGNOSIS — M170 Bilateral primary osteoarthritis of knee: Secondary | ICD-10-CM

## 2018-12-28 DIAGNOSIS — R6882 Decreased libido: Secondary | ICD-10-CM | POA: Insufficient documentation

## 2018-12-28 DIAGNOSIS — Z79899 Other long term (current) drug therapy: Secondary | ICD-10-CM | POA: Diagnosis not present

## 2018-12-28 DIAGNOSIS — R519 Headache, unspecified: Secondary | ICD-10-CM

## 2018-12-28 DIAGNOSIS — R350 Frequency of micturition: Secondary | ICD-10-CM

## 2018-12-28 DIAGNOSIS — K635 Polyp of colon: Secondary | ICD-10-CM

## 2018-12-28 DIAGNOSIS — R51 Headache: Secondary | ICD-10-CM

## 2018-12-28 DIAGNOSIS — G43009 Migraine without aura, not intractable, without status migrainosus: Secondary | ICD-10-CM

## 2018-12-28 DIAGNOSIS — Z96652 Presence of left artificial knee joint: Secondary | ICD-10-CM | POA: Insufficient documentation

## 2018-12-28 LAB — COMPLETE METABOLIC PANEL WITH GFR
AG Ratio: 2 (calc) (ref 1.0–2.5)
ALT: 11 U/L (ref 9–46)
AST: 16 U/L (ref 10–35)
Albumin: 4.3 g/dL (ref 3.6–5.1)
Alkaline phosphatase (APISO): 55 U/L (ref 35–144)
BUN: 18 mg/dL (ref 7–25)
CO2: 29 mmol/L (ref 20–32)
Calcium: 9.3 mg/dL (ref 8.6–10.3)
Chloride: 108 mmol/L (ref 98–110)
Creat: 0.95 mg/dL (ref 0.70–1.25)
GFR, Est African American: 96 mL/min/{1.73_m2} (ref >=60)
GFR, Est Non African American: 83 mL/min/{1.73_m2} (ref >=60)
Globulin: 2.1 g/dL (calc) (ref 1.9–3.7)
Glucose, Bld: 104 mg/dL — ABNORMAL HIGH (ref 65–99)
Potassium: 5.4 mmol/L — ABNORMAL HIGH (ref 3.5–5.3)
Sodium: 143 mmol/L (ref 135–146)
Total Bilirubin: 0.8 mg/dL (ref 0.2–1.2)
Total Protein: 6.4 g/dL (ref 6.1–8.1)

## 2018-12-28 LAB — PSA: PSA: 3.1 ng/mL (ref <=4.0)

## 2018-12-28 LAB — CBC MORPHOLOGY

## 2018-12-28 LAB — CBC WITH DIFFERENTIAL/PLATELET
Absolute Monocytes: 470 cells/uL (ref 200–950)
Basophils Absolute: 60 cells/uL (ref 0–200)
Basophils Relative: 1.2 %
Eosinophils Absolute: 150 cells/uL (ref 15–500)
Eosinophils Relative: 3 %
HCT: 34.7 % — ABNORMAL LOW (ref 38.5–50.0)
Hemoglobin: 12.5 g/dL — ABNORMAL LOW (ref 13.2–17.1)
Lymphs Abs: 1095 cells/uL (ref 850–3900)
MCH: 42.4 pg — ABNORMAL HIGH (ref 27.0–33.0)
MCHC: 36 g/dL (ref 32.0–36.0)
MCV: 117.6 fL — ABNORMAL HIGH (ref 80.0–100.0)
MPV: 10.8 fL (ref 7.5–12.5)
Monocytes Relative: 9.4 %
Neutro Abs: 3225 cells/uL (ref 1500–7800)
Neutrophils Relative %: 64.5 %
Platelets: 204 10*3/uL (ref 140–400)
RBC: 2.95 10*6/uL — ABNORMAL LOW (ref 4.20–5.80)
RDW: 17.6 % — ABNORMAL HIGH (ref 11.0–15.0)
Total Lymphocyte: 21.9 %
WBC: 5 10*3/uL (ref 3.8–10.8)

## 2018-12-28 MED ORDER — BUTALBITAL-APAP-CAFFEINE 50-325-40 MG PO TABS: 1.0000 | ORAL_TABLET | Freq: Two times a day (BID) | ORAL | 0 refills | Status: DC | PRN

## 2018-12-28 MED ORDER — CELECOXIB 200 MG PO CAPS: 200.0000 mg | ORAL_CAPSULE | Freq: Two times a day (BID) | ORAL | 3 refills | Status: DC

## 2018-12-28 MED ORDER — SILDENAFIL CITRATE 100 MG PO TABS: 100.0000 mg | ORAL_TABLET | Freq: Every day | ORAL | 0 refills | Status: DC | PRN

## 2018-12-28 NOTE — Progress Notes (Signed)
Subjective:    Patient ID: Jeffery Anthony, male    DOB: [birth year], 68 y.o.   MRN: [id number]  HPI  Pt is a 68 yo male with migraines, bilateral OA with recent hx of Left TKR who presents to the clinic for follow up.   He is doing well. He continues to use celebrex for his knee pain. He needs refill. Left knee doing really good continues to have pain in right knee.   Migraines are well controlled. He has 1 a month on average. He did call recently for fiorcet that he uses as needed.   He does mention some increase in urinary frequency and night time wakenings. He is also having more problems with erections.   .. Active Ambulatory Problems    Diagnosis Date Noted  . Primary osteoarthritis of both knees 07/10/2013  . Colon polyp 10/04/2013  . Migraine 12/01/2016  . 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 Benign paroxysmal positional vertigo of right ear 11/17/2017  . Primary osteoarthritis of left knee 09/16/2018  . Status post left knee replacement 12/28/2018  . Low libido 12/28/2018  . 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  . Urinary frequency 12/28/2018   Resolved Ambulatory Problems    Diagnosis Date Noted  . No Resolved Ambulatory Problems   Past Medical History:  Diagnosis Date  . Arthritis   . GERD (gastroesophageal reflux disease)   . Headache     Review of Systems  All other systems reviewed and are negative.      Objective:   Physical Exam Vitals signs reviewed.  Constitutional:      Appearance: Normal appearance.  HENT:     Head: Normocephalic and atraumatic.  Neck:     Vascular: No carotid bruit.  Cardiovascular:     Rate and Rhythm: Normal rate and regular rhythm.     Pulses: Normal pulses.     Heart sounds: No murmur.  Pulmonary:     Effort: Pulmonary effort is normal.     Breath sounds: Normal breath sounds.  Neurological:     General: No focal deficit present.     Mental Status: He is alert and  oriented to person, place, and time.  Psychiatric:        Mood and Affect: Mood normal.       Pt declined DRE today.     Assessment & Plan:  Marland KitchenMarland KitchenDiagnoses and all orders for this visit:  Primary osteoarthritis of both knees -     celecoxib (CELEBREX) 200 MG capsule; Take 1 capsule (200 mg total) by mouth 2 (two) times daily. One to 2 tablets by mouth daily as needed for pain.  Urinary frequency -     PSA -     CBC with Differential/Platelet  ED (erectile dysfunction) of organic origin -     PSA -     sildenafil (VIAGRA) 100 MG tablet; Take 1 tablet (100 mg total) by mouth daily as needed for erectile dysfunction. -     CBC with Differential/Platelet -     Testosterone  Medication management -     COMPLETE METABOLIC PANEL WITH GFR -     CBC with Differential/Platelet  Polyp of colon, unspecified part of colon, unspecified type -     Ambulatory referral to Gastroenterology  Colon cancer screening -     Ambulatory referral to Gastroenterology  Low libido -     Testosterone  Migraine without aura and without status migrainosus, not intractable  Status  post left knee replacement  Acute intractable headache, unspecified headache type Comments: Offered CT today but patient declines. ER precautions reviewed. If no better by tomorrow we'll need to consider urgent imaging. Orders: -     butalbital-acetaminophen-caffeine (FIORICET, ESGIC) 50-325-40 MG tablet; Take 1-2 tablets by mouth 2 (two) times daily as needed for headache.   ..IPSS Questionnaire (AUA-7): Over the past month.   1)  How often have you had a sensation of not emptying your bladder completely after you finish urinating?  1 - Less than 1 time in 5  2)  How often have you had to urinate again less than two hours after you finished urinating? 3 - About half the time  3)  How often have you found you stopped and started again several times when you urinated?  3 - About half the time  4) How difficult have you  found it to postpone urination?  3 - About half the time  5) How often have you had a weak urinary stream?  4 - More than half the time  6) How often have you had to push or strain to begin urination?  1 - Less than 1 time in 5  7) How many times did you most typically get up to urinate from the time you went to bed until the time you got up in the morning?  2 - 2 times  Total score:  0-7 mildly symptomatic   8-19 moderately symptomatic   20-35 severely symptomatic    Total 17, moderate.   ED questionnaire 15.   Will test PSA, testosterone.   Start saw palmetto. viagra for as needed usage. If significant increase in PSA will send to urology. 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 We could consider flomax if no improvement with saw palmetto.   Encouraged pneumonia vaccine. Declined today. Concerned about GBS and flu vaccine. Discussed no link to pneumonia vaccine. Will consider in the future.   Marland KitchenMarland KitchenPDMP reviewed during this encounter. Will send fiorcet for as needed migraine rescue when his first line does not work.   Hx of colon polyp. Needs follow up colonoscopy. Order sent.   Follow up in 2 months.

## 2018-12-28 NOTE — Patient Instructions (Signed)
Saw Palmetto, Serenoa repens tablets and capsules What is this medicine? SAW PALMETTO (saw pal MET oh) is an herbal or dietary supplement. It is promoted to help support prostate health. The FDA has not approved this supplement for any medical use. This supplement may be used for other purposes; ask your health care provider or pharmacist if you have questions. This medicine may be used for other purposes; ask your health care provider or pharmacist if you have questions. What should I tell my health care provider before I take this medicine? They need to know if you have any of these conditions: -liver disease -prostate cancer -an unusual or allergic reaction to saw palmetto, other herbs, plants, medicines, foods, dyes, or preservatives -pregnant or trying to get pregnant -breast-feeding How should I use this medicine? Take this supplement by mouth with a glass of water. Follow the directions on the package labeling, or take as directed by your health care professional. If this supplement upsets your stomach, take it with food. Do not take this supplement more often than directed. Contact your pediatrician regarding the use of this supplement in children. Special care may be needed. Overdosage: If you think you have taken too much of this medicine contact a poison control center or emergency room at once. NOTE: This medicine is only for you. Do not share this medicine with others. What if I miss a dose? If you miss a dose, take it as soon as you can. If it is almost time for your next dose, take only that dose. Do not take double or extra doses. What may interact with this medicine? -other medicines for the prostate like finasteride, tamsulosin -hormones -warfarin This list may not describe all possible interactions. Give your health care provider a list of all the medicines, herbs, non-prescription drugs, or dietary supplements you use. Also tell them if you smoke, drink alcohol, or use  illegal drugs. Some items may interact with your medicine. What should I watch for while using this medicine? Tell your doctor or healthcare professional if your symptoms do not start to get better or if they get worse. If you are scheduled for any medical or dental procedure, tell your healthcare provider that you are taking this supplement. You may need to stop taking this supplement before the procedure. Herbal or dietary supplements are not regulated like medicines. Rigid quality control standards are not required for dietary supplements. The purity and strength of these products can vary. The safety and effect of this dietary supplement for a certain disease or illness is not well known. This product is not intended to diagnose, treat, cure or prevent any disease. The Food and Drug Administration suggests the following to help consumers protect themselves: -Always read product labels and follow directions. -Natural does not mean a product is safe for humans to take. -Look for products that include USP after the ingredient name. This means that the manufacturer followed the standards of the [place] Pharmacopoeia. -Supplements made or sold by a nationally known food or drug company are more likely to be made under tight controls. You can write to the company for more information about how the product was made. What side effects may I notice from receiving this medicine? Side effects that you should report to your doctor or health care professional as soon as possible: -allergic reactions like skin rash, itching or hives, swelling of the face, lips, or tongue -breathing problems -dark urine -fever -general ill feeling or flu-like symptoms -light-colored stools -loss  of appetite, nausea -right upper belly pain -trouble passing urine or change in the amount of urine -unusually weak or tired -yellowing of the eyes or skin Side effects that usually do not require medical attention (report to your  doctor or health care professional if they continue or are bothersome): -breast tenderness or enlargement -diarrhea -headache -stomach upset This list may not describe all possible side effects. Call your doctor for medical advice about side effects. You may report side effects to FDA at 1-800-FDA-1088. Where should I keep my medicine? Keep out of the reach of children. Store at room temperature between 15 and 30 degrees C (59 and 86 degrees F) or as directed on the package label. Protect from moisture. Throw away any unused supplement after the expiration date. NOTE: This sheet is a summary. It may not cover all possible information. If you have questions about this medicine, talk to your doctor, pharmacist, or health care provider.  2019 Elsevier/Gold Standard (2008-06-14 15:15:00)

## 2018-12-29 ENCOUNTER — Encounter: Payer: Self-pay | Admitting: Internal Medicine

## 2018-12-30 NOTE — Progress Notes (Signed)
Call pt: PSA increase just a minimal amt. Likely enlarging of prostate. Start with saw palmetto then as needed we can send over precription medication.  Kidney and liver look great.   Potassium is just a hair elevated. We need to watch this. Recheck in 2 weeks just to see if trending. If you were a little dehydrated blood could have been more concentrated.   Your hemoglobin and RBCs are low with need to do an iron work up. I am going to see if we can do on the same blood.  Can we add (ferritin, TIBC, RDW, serum iron)     Testosterone still pending.

## 2019-01-20 ENCOUNTER — Other Ambulatory Visit: Payer: Self-pay | Admitting: Physician Assistant

## 2019-01-26 ENCOUNTER — Other Ambulatory Visit: Payer: Self-pay

## 2019-01-26 DIAGNOSIS — D649 Anemia, unspecified: Secondary | ICD-10-CM

## 2019-02-01 ENCOUNTER — Encounter: Payer: Commercial Managed Care - PPO | Admitting: Internal Medicine

## 2019-05-15 ENCOUNTER — Encounter: Payer: Self-pay | Admitting: Osteopathic Medicine

## 2019-05-15 ENCOUNTER — Ambulatory Visit: Payer: Commercial Managed Care - PPO | Admitting: Physician Assistant

## 2019-05-15 ENCOUNTER — Ambulatory Visit (INDEPENDENT_AMBULATORY_CARE_PROVIDER_SITE_OTHER): Payer: Commercial Managed Care - PPO | Admitting: Osteopathic Medicine

## 2019-05-15 VITALS — BP 128/81 | HR 52 | Temp 98.2°F | Wt 226.3 lb

## 2019-05-15 DIAGNOSIS — M79642 Pain in left hand: Secondary | ICD-10-CM | POA: Diagnosis not present

## 2019-05-15 DIAGNOSIS — R202 Paresthesia of skin: Secondary | ICD-10-CM | POA: Diagnosis not present

## 2019-05-15 DIAGNOSIS — R200 Anesthesia of skin: Secondary | ICD-10-CM

## 2019-05-15 DIAGNOSIS — M79641 Pain in right hand: Secondary | ICD-10-CM | POA: Insufficient documentation

## 2019-05-15 MED ORDER — PREDNISONE 20 MG PO TABS: 20.0000 mg | ORAL_TABLET | Freq: Two times a day (BID) | ORAL | 0 refills | Status: DC

## 2019-05-15 NOTE — Patient Instructions (Addendum)
Plan:  Would take the Celebrex or ibuprofen regularly for the next 2 weeks.  I sent in a burst of steroids.  We will get x-rays today and will check B12 levels on blood work.  Please follow-up with Dr. Darene Lamer in 2 weeks, or let us know sooner if something changes or gets worse!

## 2019-05-15 NOTE — Progress Notes (Signed)
HPI: Jeffery Anthony is a 68 y.o. male who  has a past medical history of Arthritis, GERD (gastroesophageal reflux disease), and Headache.  he presents to [facility] today, 05/15/19,  for chief complaint of:  Hand pain / numbness  . Context: no injury  . Location: hands/wrists bilaterally, radiating up the arms a bit  . Quality: aching and stiffness, difficulty with fine motor skills and grip. Hands feel numb. Pain at L thumb base worse.  . Severity: initially not back in 12/2018, worse a few mos later but seems to have plateau'ed.  . Duration: since 12/2018 (3-4 mos ago) . Timing: constant, worse in evenings  . Modifying factors: massage and ibuprofen help.     At today's visit 05/15/19 ... PMH, PSH, FH reviewed and updated as needed.  Current medication list and allergy/intolerance hx reviewed and updated as needed. (See remainder of HPI, ROS, Phys Exam below)   No results found.  No results found for this or any previous visit (from the past 72 hour(s)).        ASSESSMENT/PLAN: The primary encounter diagnosis was Pain in both hands. Diagnoses of Hand numbness and Paresthesia of both hands were also pertinent to this visit.   Orders Placed This Encounter  Procedures  . DG Hand Complete Left  . DG Hand Complete Right  . CBC  . Vitamin B12  . BASIC METABOLIC PANEL WITH GFR     Meds ordered this encounter  Medications  . predniSONE (DELTASONE) 20 MG tablet    Sig: Take 1 tablet (20 mg total) by mouth 2 (two) times daily with a meal.    Dispense:  10 tablet    Refill:  0    Patient Instructions  Plan:  Would take the Celebrex or ibuprofen regularly for the next 2 weeks.  I sent in a burst of steroids.  We will get x-rays today and will check B12 levels on blood work.  Please follow-up with Jeffery Anthony in 2 weeks, or let us know sooner if something changes or gets worse!      Follow-up plan: Return in about 2 weeks (around  05/29/2019) for VISIT WITH SPORTS MEDICINE (DR T) FOR ORTHOPEDIC ISSUE.    ADDENDUM: I had given pt verbal instructions, printed AVS and labs, my CMA was preparing the paperwork for the wrist splints when patient walked out of exam room and left the office saying"i'm not going to do any of that stuff." My CMA attempted to speak with him more about the issue but he walked away.                                              ################################################# ################################################# ################################################# #################################################    Current Meds  Medication Sig  . aspirin-acetaminophen-caffeine (EXCEDRIN EXTRA STRENGTH) 295-188-41 MG tablet Take 1 tablet by mouth every 6 (six) hours as needed for migraine.  . butalbital-acetaminophen-caffeine (FIORICET, ESGIC) 50-325-40 MG tablet Take 1-2 tablets by mouth 2 (two) times daily as needed for headache.  . celecoxib (CELEBREX) 200 MG capsule Take 1 capsule (200 mg total) by mouth 2 (two) times daily. One to 2 tablets by mouth daily as needed for pain.  . sildenafil (VIAGRA) 100 MG tablet Take 1 tablet (100 mg total) by mouth daily as needed for erectile dysfunction.  . timolol (TIMOPTIC) 0.5 % ophthalmic solution  Place 1 drop into both eyes 2 (two) times daily as needed (headache/eye pressure).     Allergies  Allergen Reactions  . Iodinated Diagnostic Agents Nausea And Vomiting and Other (See Comments)    iodine dye       Review of Systems:  Constitutional: No recent illness  Cardiac: No  chest pain  Respiratory:  No  shortness of breath. No  Cough  Musculoskeletal: +myalgia/arthralgia per HPI. NO neck pain.   Skin: No  Rash  Neurologic: +hand weakness, No  Dizziness or headache.   Psychiatric: No  concerns with depression, No  concerns with anxiety  Exam:  BP 128/81 (BP Location: Left Arm,  Patient Position: Sitting, Cuff Size: Normal)   Pulse (!) 52   Temp 98.2 F (36.8 C) (Oral)   Wt 226 lb 4.8 oz (102.6 kg)   BMI 31.56 kg/m   Constitutional: VS see above. General Appearance: alert, well-developed, well-nourished, NAD  Neck: No masses, trachea midline.   Respiratory: Normal respiratory effort.   Musculoskeletal: Gait normal. Symmetric and independent movement of all extremities  No edema/ecchymoses or effusion at joints of hands/wrists  Neg tinels & phalens bilaterally   Pain w/ flexion/extension of wrists  Fingertip sensation intact to light touch   Neurological: Normal balance/coordination. No tremor.  Skin: warm, dry, intact.   Psychiatric: Normal judgment/insight. Normal mood and affect. Oriented x3.       Visit summary with medication list and pertinent instructions was printed for patient to review, patient was advised to alert [place] if any updates are needed. All questions at time of visit were answered - patient instructed to contact office with any additional concerns. ER/RTC precautions were reviewed with the patient and understanding verbalized.     Please note: voice recognition software was used to produce this document, and typos may escape review. Please contact Jeffery Anthony for any needed clarifications.    Follow up plan: Return in about 2 weeks (around 05/29/2019) for VISIT WITH SPORTS MEDICINE (DR T) FOR ORTHOPEDIC ISSUE.

## 2019-08-18 ENCOUNTER — Telehealth: Payer: Self-pay | Admitting: Neurology

## 2019-08-18 NOTE — Telephone Encounter (Signed)
Left message on machine for patient to call back to let us know what kind of symptoms he is having. Awaiting call back. [place].

## 2019-08-18 NOTE — Telephone Encounter (Signed)
Patient left vm informing us he did test positive for Covid.

## 2019-12-07 ENCOUNTER — Telehealth: Payer: Self-pay | Admitting: Neurology

## 2019-12-07 NOTE — Telephone Encounter (Signed)
Patient's wife made aware. She will try to get him to go to urgent care for evaluations. They will bring any records they can on Monday. Patient did want to clarify that he doesn't feel "dizzy" but his "eyes go funny" then he falls. Let him know recommendation stays the same - ER as first option and if resistant to at least see urgent care. They express understanding.

## 2019-12-07 NOTE — Telephone Encounter (Signed)
Patient's wife Mechele Claude called and left vm stating patient is having issues with dizziness, falling, and numbness in right arm. They are out of town and won't be back til next week. They have an appt Monday with [facility] but wanted a call back. [phone].

## 2019-12-07 NOTE — Telephone Encounter (Signed)
Spoke with patient's wife.   She states patient has been complaining of lightheadedness off and on for 6 months.  Having bilateral hand numbness off and on for 2 months. Now having numbness in feet.  Thursday he had episode of dizziness so severe he is having falls. At that time had pain shooting down right arm. No injuries with fall. No chest pain. No SOB. No change in any medications. No loss of vision or vision changes.  He had another episode with dizziness, pain down right arm, and mouth numbness. Patient refused to be taken to the hospital. He has appt on Monday with [name], but they are in [place] out of town right now.  Please advise.

## 2019-12-07 NOTE — Telephone Encounter (Signed)
It sounds concernins and sounds like it is getting worse.  I think he s should be seen by provider.  If he does not want to go to the hospital is there an urgent care that he could at least go to be assessed?

## 2019-12-08 NOTE — Telephone Encounter (Signed)
I still recommended being seen by UC to assess the situation. It is sounded more like vertigo. Keep follow up on Monday.

## 2019-12-11 ENCOUNTER — Encounter: Payer: Self-pay | Admitting: Physician Assistant

## 2019-12-11 ENCOUNTER — Ambulatory Visit (INDEPENDENT_AMBULATORY_CARE_PROVIDER_SITE_OTHER): Payer: 59 | Admitting: Physician Assistant

## 2019-12-11 ENCOUNTER — Other Ambulatory Visit: Payer: Self-pay

## 2019-12-11 VITALS — Ht 71.0 in | Wt 243.0 lb

## 2019-12-11 DIAGNOSIS — I951 Orthostatic hypotension: Secondary | ICD-10-CM | POA: Insufficient documentation

## 2019-12-11 DIAGNOSIS — Z13220 Encounter for screening for lipoid disorders: Secondary | ICD-10-CM

## 2019-12-11 DIAGNOSIS — Z8616 Personal history of COVID-19: Secondary | ICD-10-CM

## 2019-12-11 DIAGNOSIS — R413 Other amnesia: Secondary | ICD-10-CM

## 2019-12-11 DIAGNOSIS — Z823 Family history of stroke: Secondary | ICD-10-CM | POA: Insufficient documentation

## 2019-12-11 DIAGNOSIS — M79601 Pain in right arm: Secondary | ICD-10-CM | POA: Insufficient documentation

## 2019-12-11 DIAGNOSIS — Z131 Encounter for screening for diabetes mellitus: Secondary | ICD-10-CM

## 2019-12-11 DIAGNOSIS — R29818 Other symptoms and signs involving the nervous system: Secondary | ICD-10-CM

## 2019-12-11 DIAGNOSIS — Z820 Family history of epilepsy and other diseases of the nervous system: Secondary | ICD-10-CM | POA: Insufficient documentation

## 2019-12-11 DIAGNOSIS — R42 Dizziness and giddiness: Secondary | ICD-10-CM | POA: Diagnosis not present

## 2019-12-11 DIAGNOSIS — R202 Paresthesia of skin: Secondary | ICD-10-CM

## 2019-12-11 DIAGNOSIS — R4789 Other speech disturbances: Secondary | ICD-10-CM | POA: Insufficient documentation

## 2019-12-11 DIAGNOSIS — G44039 Episodic paroxysmal hemicrania, not intractable: Secondary | ICD-10-CM

## 2019-12-11 DIAGNOSIS — R200 Anesthesia of skin: Secondary | ICD-10-CM | POA: Insufficient documentation

## 2019-12-11 NOTE — Patient Instructions (Addendum)
Will get MRI of brain.  Will get labs.  Will get EMG to evaluate for carpel tunnel.  Baby ASA 81mg .    Orthostatic Hypotension Blood pressure is a measurement of how strongly, or weakly, your blood is pressing against the walls of your arteries. Orthostatic hypotension is a sudden drop in blood pressure that happens when you quickly change positions, such as when you get up from sitting or lying down. Arteries are blood vessels that carry blood from your heart throughout your body. When blood pressure is too low, you may not get enough blood to your brain or to the rest of your organs. This can cause weakness, light-headedness, rapid heartbeat, and fainting. This can last for just a few seconds or for up to a few minutes. Orthostatic hypotension is usually not a serious problem. However, if it happens frequently or gets worse, it may be a sign of something more serious. What are the causes? This condition may be caused by:  Sudden changes in posture, such as standing up quickly after you have been sitting or lying down.  Blood loss.  Loss of body fluids (dehydration).  Heart problems.  Hormone (endocrine) problems.  Pregnancy.  Severe infection.  Lack of certain nutrients.  Severe allergic reactions (anaphylaxis).  Certain medicines, such as blood pressure medicine or medicines that make the body lose excess fluids (diuretics). Sometimes, this condition can be caused by not taking medicine as directed, such as taking too much of a certain medicine. What increases the risk? The following factors may make you more likely to develop this condition:  Age. Risk increases as you get older.  Conditions that affect the heart or the central nervous system.  Taking certain medicines, such as blood pressure medicine or diuretics.  Being pregnant. What are the signs or symptoms? Symptoms of this condition may include:  Weakness.  Light-headedness.  Dizziness.  Blurred  vision.  Fatigue.  Rapid heartbeat.  Fainting, in severe cases. How is this diagnosed? This condition is diagnosed based on:  Your medical history.  Your symptoms.  Your blood pressure measurement. Your health care provider will check your blood pressure when you are: ? Lying down. ? Sitting. ? Standing. A blood pressure reading is recorded as two numbers, such as "120 over 80" (or 120/80). The first ("top") number is called the systolic pressure. It is a measure of the pressure in your arteries as your heart beats. The second ("bottom") number is called the diastolic pressure. It is a measure of the pressure in your arteries when your heart relaxes between beats. Blood pressure is measured in a unit called mm Hg. Healthy blood pressure for most adults is 120/80. If your blood pressure is below 90/60, you may be diagnosed with hypotension. Other information or tests that may be used to diagnose orthostatic hypotension include:  Your other vital signs, such as your heart rate and temperature.  Blood tests.  Tilt table test. For this test, you will be safely secured to a table that moves you from a lying position to an upright position. Your heart rhythm and blood pressure will be monitored during the test. How is this treated? This condition may be treated by:  Changing your diet. This may involve eating more salt (sodium) or drinking more water.  Taking medicines to raise your blood pressure.  Changing the dosage of certain medicines you are taking that might be lowering your blood pressure.  Wearing compression stockings. These stockings help to prevent blood clots  and reduce swelling in your legs. In some cases, you may need to go to the hospital for:  Fluid replacement. This means you will receive fluids through an IV.  Blood replacement. This means you will receive donated blood through an IV (transfusion).  Treating an infection or heart problems, if this  applies.  Monitoring. You may need to be monitored while medicines that you are taking wear off. Follow these instructions at home: Eating and drinking   Drink enough fluid to keep your urine pale yellow.  Eat a healthy diet, and follow instructions from your health care provider about eating or drinking restrictions. A healthy diet includes: ? Fresh fruits and vegetables. ? Whole grains. ? Lean meats. ? Low-fat dairy products.  Eat extra salt only as directed. Do not add extra salt to your diet unless your health care provider told you to do that.  Eat frequent, small meals.  Avoid standing up suddenly after eating. Medicines  Take over-the-counter and prescription medicines only as told by your health care provider. ? Follow instructions from your health care provider about changing the dosage of your current medicines, if this applies. ? Do not stop or adjust any of your medicines on your own. General instructions   Wear compression stockings as told by your health care provider.  Get up slowly from lying down or sitting positions. This gives your blood pressure a chance to adjust.  Avoid hot showers and excessive heat as directed by your health care provider.  Return to your normal activities as told by your health care provider. Ask your health care provider what activities are safe for you.  Do not use any products that contain nicotine or tobacco, such as cigarettes, e-cigarettes, and chewing tobacco. If you need help quitting, ask your health care provider.  Keep all follow-up visits as told by your health care provider. This is important. Contact a health care provider if you:  Vomit.  Have diarrhea.  Have a fever for more than 2-3 days.  Feel more thirsty than usual.  Feel weak and tired. Get help right away if you:  Have chest pain.  Have a fast or irregular heartbeat.  Develop numbness in any part of your body.  Cannot move your arms or your  legs.  Have trouble speaking.  Become sweaty or feel light-headed.  Faint.  Feel short of breath.  Have trouble staying awake.  Feel confused. Summary  Orthostatic hypotension is a sudden drop in blood pressure that happens when you quickly change positions.  Orthostatic hypotension is usually not a serious problem.  It is diagnosed by having your blood pressure taken lying down, sitting, and then standing.  It may be treated by changing your diet or adjusting your medicines. This information is not intended to replace advice given to you by your health care provider. Make sure you discuss any questions you have with your health care provider. Document Revised: 04/07/2018 Document Reviewed: 04/07/2018 Elsevier Patient Education  [place].

## 2019-12-11 NOTE — Progress Notes (Addendum)
Subjective:    Patient ID: Jeffery Anthony, male    DOB: Mar 02, 1951, 69 y.o.   MRN: [id number]  HPI  Pt is a 69 yo male with hx of migraines, guillain Barre, BPPV who presents to the clinic with his wife to discuss some neurological changes.   Last year changes started in feb 2020 he noticed some tingling and numbness in both hands and struggling to find words. He has tried to ignore it and then started having some recent symptoms that were concerning to him and wife.   Thursday 11/30/2019 In the evening he was standing in bathroom and eyes became unfocused and legs weak. He grabbed the side of bathtub and fell to the floor. Before falling a pain shot down his right arm. He was nauseated. He could not get up for about 10 minutes. Went straight to bed and had to lie flat. He had a terrible headache and then next day worse tingling and numbness in hands.   Monday 12/04/2019 Complained of pain in right arm and hand and feet.   Wednesday 12/06/19  He was walking in the bedroom and eyes became unfocused and pain shot down right arm to hand and he fell into bed. His mouth felt numb. Had a headache that subsided that night. Struggled to formulate sentence and slow to find words.   Not gone to ED for any symptoms. Wife reports short tempered and forgetting things.   Father has had TIA, heart disease, CAD with stents, and memory changes. He takes aricept. Mother has essential tremors.   Pt had covid end of October.   He has noticed some intention tremor when witting.   Last MRI due to worsening migraines was 2018. No acute changes.    . Family History  Problem Relation Age of Onset  . Hyperlipidemia Father   . Hypertension Father   . Heart attack Father   . Colon cancer Neg Hx     .Marland Kitchen Social History   Socioeconomic History  . Marital status: Married    Spouse name: Not on file  . Number of children: Not on file  . Years of education: Not on file  . Highest education level: Not on  file  Occupational History  . Not on file  Tobacco Use  . Smoking status: Never Smoker  . Smokeless tobacco: Never Used  Substance and Sexual Activity  . Alcohol use: Yes    Alcohol/week: 1.0 standard drinks    Types: 1 Glasses of wine per week  . Drug use: No  . Sexual activity: Yes  Other Topics Concern  . Not on file  Social History Narrative  . Not on file   Social Determinants of Health   Financial Resource Strain:   . Difficulty of Paying Living Expenses: Not on file  Food Insecurity:   . Worried About Charity fundraiser in the Last Year: Not on file  . Ran Out of Food in the Last Year: Not on file  Transportation Needs:   . Lack of Transportation (Medical): Not on file  . Lack of Transportation (Non-Medical): Not on file  Physical Activity:   . Days of Exercise per Week: Not on file  . Minutes of Exercise per Session: Not on file  Stress:   . Feeling of Stress : Not on file  Social Connections:   . Frequency of Communication with Friends and Family: Not on file  . Frequency of Social Gatherings with Friends and Family: Not on file  .  Attends Religious Services: Not on file  . Active Member of Clubs or Organizations: Not on file  . Attends Archivist Meetings: Not on file  . Marital Status: Not on file  Intimate Partner Violence:   . Fear of Current or Ex-Partner: Not on file  . Emotionally Abused: Not on file  . Physically Abused: Not on file  . Sexually Abused: Not on file   .Marland Kitchen Active Ambulatory Problems    Diagnosis Date Noted  . Primary osteoarthritis of both knees 07/10/2013  . Colon polyp 10/04/2013  . Migraine 12/01/2016  . 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 Benign paroxysmal positional vertigo of right ear 11/17/2017  . Primary osteoarthritis of left knee 09/16/2018  . Status post left knee replacement 12/28/2018  . Low libido 12/28/2018  . 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  . Urinary  frequency 12/28/2018  . Pain in both hands 05/15/2019  . Numbness and tingling of right arm 05/15/2019  . Paresthesia of both hands 05/15/2019  . Orthostatic hypotension 12/11/2019  . Right arm pain 12/11/2019  . Family history of TIAs 12/11/2019  . Episode of dizziness 12/11/2019  . Memory changes 12/11/2019  . History of COVID-19 12/11/2019  . Word finding difficulty 12/11/2019  . Numbness around mouth 12/11/2019   Resolved Ambulatory Problems    Diagnosis Date Noted  . No Resolved Ambulatory Problems   Past Medical History:  Diagnosis Date  . Arthritis   . GERD (gastroesophageal reflux disease)   . Headache        Review of Systems See HPI.     Objective:   Physical Exam Vitals reviewed.  Constitutional:      Appearance: Normal appearance.  HENT:     Head: Normocephalic.     Right Ear: Tympanic membrane normal.     Left Ear: Tympanic membrane normal.     Ears:     Comments: Air bubbles behind TMs.     Nose: Nose normal.     Mouth/Throat:     Mouth: Mucous membranes are moist.  Eyes:     Extraocular Movements: Extraocular movements intact.     Pupils: Pupils are equal, round, and reactive to light.  Cardiovascular:     Rate and Rhythm: Normal rate and regular rhythm.     Pulses: Normal pulses.     Heart sounds: No murmur.  Pulmonary:     Effort: Pulmonary effort is normal.     Breath sounds: Normal breath sounds.  Musculoskeletal:     Cervical back: Normal range of motion. No rigidity.     Comments: Upper and lower extremity strength 5/5.  Negative tinels bilateral wrist.  Positive phalen in left hand.   Lymphadenopathy:     Cervical: No cervical adenopathy.  Neurological:     General: No focal deficit present.     Mental Status: He is alert and oriented to person, place, and time.     Cranial Nerves: No cranial nerve deficit.     Motor: No weakness.     Coordination: Coordination normal.     Gait: Gait normal.     Deep Tendon Reflexes: Reflexes  normal.     Comments: dix hallpike negative for dizziness or nystagmus.   Psychiatric:        Mood and Affect: Mood normal.           Assessment & Plan:  Marland KitchenMarland KitchenToni was seen today for dizziness.  Diagnoses and all orders for this visit:  Episode of dizziness -     RPR -     Vitamin B12 -     Sedimentation rate -     CBC -     TSH -     Folate -     COMPLETE METABOLIC PANEL WITH GFR  Memory changes -     RPR -     Vitamin B12 -     Sedimentation rate -     CBC -     TSH -     Folate -     Lipid Panel w/reflex Direct LDL -     COMPLETE METABOLIC PANEL WITH GFR -     HIV antibody (with reflex)  Family history of TIAs -     Lipid Panel w/reflex Direct LDL  Screening for lipid disorders -     Lipid Panel w/reflex Direct LDL  Screening for diabetes mellitus -     COMPLETE METABOLIC PANEL WITH GFR  Numbness and tingling of right arm  Right arm pain  Bilateral hand numbness -     RPR -     Vitamin B12 -     Sedimentation rate -     CBC -     TSH -     Folate -     COMPLETE METABOLIC PANEL WITH GFR  Orthostatic hypotension  History of COVID-19  Word finding difficulty  Numbness around mouth -     Vitamin B12   EKG-no acute changes from 2019. Sinus bradycardia.   MMSE - Mini Mental State Exam 12/11/2019  Orientation to time 5  Orientation to Place 5  Registration 3  Attention/ Calculation 5  Recall 3  Language- name 2 objects 2  Language- repeat 1  Language- follow 3 step command 3  Language- read & follow direction 1  Write a sentence 1  Copy design 1  Total score 30   Perfect MMSE. Will get dementia labs.   Discussed orthostatic hypotension. He reports used to this feeling when standing but the episodes of dizziness feels nothing like that. Discussed vertigo sensation as something else too.  Concern for stroke like symptoms or at the least TIA symptoms especially with oral numbness. Need MRI of brain.   Will get lipids checked.  Start  ASA 81mg  daily.  Keep BP under 140/90.   Tremor discussed not bad enough for treatment no alarming features of tremor today.   With expressed numbness and tingling consider carpal tunnel as etiology of bilateral hand numbness and tingling. Will consider EMG in future.   Unclear if any correlation to COVID in October and any sequela.   Follow up after testing and imaging or sooner if needed.   Spent 45 minutes with patient care.

## 2019-12-12 ENCOUNTER — Encounter: Payer: Self-pay | Admitting: Physician Assistant

## 2019-12-12 DIAGNOSIS — E785 Hyperlipidemia, unspecified: Secondary | ICD-10-CM | POA: Insufficient documentation

## 2019-12-12 DIAGNOSIS — E538 Deficiency of other specified B group vitamins: Secondary | ICD-10-CM | POA: Insufficient documentation

## 2019-12-12 MED ORDER — ATORVASTATIN CALCIUM 40 MG PO TABS: 40.0000 mg | ORAL_TABLET | Freq: Every day | ORAL | 3 refills | Status: DC

## 2019-12-12 NOTE — Addendum Note (Signed)
Addended by: Donella Stade on: 12/12/2019 02:21 PM   Modules accepted: Orders

## 2019-12-12 NOTE — Progress Notes (Signed)
Call pt:  Your vitamin b12 is very low this can cause numbness and tingling, memory changes, word finding trouble, depression a lot of your symptoms could be explained by this. I would like you to come in and get a b12 shot now and then in 2 weeks another shot then monthly. In 2 months we can see where you level is. At that time we could decided if we continue with shots monthly or try oral b12. Sometimes people do not absorb the oral b12 as well. I want to get you up faster so we can see if your symptoms improve.   No anemia. WBC looks good.  Thyroid perfect. Folate good.  Potassium and sodium look good.  Kidney and liver look good.  Inflammation rate is low.  Glucose is just a hair elevated. Will add a1c.  LDL up some from 2 years ago due to age and risk factors your 10 year CV risk is 15.9 percent. AHA and my recommendation would be to start a statin drug to help lower LDL and decrease CV risk. Are you ok with this?   This is a lot of information. Would patient like a call from me to discuss/answer questions?

## 2019-12-12 NOTE — Progress Notes (Signed)
Sent statin.

## 2019-12-13 ENCOUNTER — Encounter: Payer: Self-pay | Admitting: Physician Assistant

## 2019-12-13 DIAGNOSIS — R7303 Prediabetes: Secondary | ICD-10-CM | POA: Insufficient documentation

## 2019-12-13 LAB — LIPID PANEL W/REFLEX DIRECT LDL
Cholesterol: 195 mg/dL (ref <200)
HDL: 43 mg/dL (ref >=40)
LDL Cholesterol (Calc): 127 mg/dL (calc) — ABNORMAL HIGH
Non-HDL Cholesterol (Calc): 152 mg/dL (calc) — ABNORMAL HIGH (ref <130)
Total CHOL/HDL Ratio: 4.5 (calc) (ref <5.0)
Triglycerides: 141 mg/dL (ref <150)

## 2019-12-13 LAB — COMPLETE METABOLIC PANEL WITH GFR
AG Ratio: 1.9 (calc) (ref 1.0–2.5)
ALT: 17 U/L (ref 9–46)
AST: 15 U/L (ref 10–35)
Albumin: 4.3 g/dL (ref 3.6–5.1)
Alkaline phosphatase (APISO): 45 U/L (ref 35–144)
BUN: 17 mg/dL (ref 7–25)
CO2: 29 mmol/L (ref 20–32)
Calcium: 9.7 mg/dL (ref 8.6–10.3)
Chloride: 108 mmol/L (ref 98–110)
Creat: 1.1 mg/dL (ref 0.70–1.25)
GFR, Est African American: 80 mL/min/{1.73_m2} (ref >=60)
GFR, Est Non African American: 69 mL/min/{1.73_m2} (ref >=60)
Globulin: 2.3 g/dL (calc) (ref 1.9–3.7)
Glucose, Bld: 104 mg/dL — ABNORMAL HIGH (ref 65–99)
Potassium: 4.4 mmol/L (ref 3.5–5.3)
Sodium: 143 mmol/L (ref 135–146)
Total Bilirubin: 0.7 mg/dL (ref 0.2–1.2)
Total Protein: 6.6 g/dL (ref 6.1–8.1)

## 2019-12-13 LAB — RPR: RPR Ser Ql: NONREACTIVE

## 2019-12-13 LAB — CBC
HCT: 39.7 % (ref 38.5–50.0)
Hemoglobin: 14.1 g/dL (ref 13.2–17.1)
MCH: 41.5 pg — ABNORMAL HIGH (ref 27.0–33.0)
MCHC: 35.5 g/dL (ref 32.0–36.0)
MCV: 116.8 fL — ABNORMAL HIGH (ref 80.0–100.0)
MPV: 10.5 fL (ref 7.5–12.5)
Platelets: 224 10*3/uL (ref 140–400)
RBC: 3.4 10*6/uL — ABNORMAL LOW (ref 4.20–5.80)
RDW: 14.7 % (ref 11.0–15.0)
WBC: 4.8 10*3/uL (ref 3.8–10.8)

## 2019-12-13 LAB — HEMOGLOBIN A1C W/OUT EAG: Hgb A1c MFr Bld: 5.9 % of total Hgb — ABNORMAL HIGH (ref <5.7)

## 2019-12-13 LAB — TSH: TSH: 2.54 mIU/L (ref 0.40–4.50)

## 2019-12-13 LAB — SEDIMENTATION RATE: Sed Rate: 6 mm/h (ref 0–20)

## 2019-12-13 LAB — HIV ANTIBODY (ROUTINE TESTING W REFLEX): HIV 1&2 Ab, 4th Generation: NONREACTIVE

## 2019-12-13 LAB — FOLATE: Folate: 14.5 ng/mL

## 2019-12-13 LAB — VITAMIN B12: Vitamin B-12: 153 pg/mL — ABNORMAL LOW (ref 200–1100)

## 2019-12-13 NOTE — Progress Notes (Signed)
A1C is 5.9. pre-diabetes range. Please watch sugar, carb, starch intake and get regular exercise. Will recheck in 6 months.

## 2019-12-14 ENCOUNTER — Ambulatory Visit: Payer: 59

## 2019-12-15 ENCOUNTER — Telehealth: Payer: Self-pay | Admitting: Neurology

## 2019-12-15 NOTE — Telephone Encounter (Signed)
Patient was supposed to start B12 injections yesterday but appt cancelled due to the weather. He would like to r/s his nurse appt. Please call patient or can reach his wife at [phone]. Thanks!

## 2019-12-18 ENCOUNTER — Other Ambulatory Visit: Payer: Self-pay | Admitting: Physician Assistant

## 2019-12-18 ENCOUNTER — Other Ambulatory Visit: Payer: Self-pay

## 2019-12-18 ENCOUNTER — Ambulatory Visit (INDEPENDENT_AMBULATORY_CARE_PROVIDER_SITE_OTHER): Payer: 59

## 2019-12-18 DIAGNOSIS — G44039 Episodic paroxysmal hemicrania, not intractable: Secondary | ICD-10-CM | POA: Diagnosis not present

## 2019-12-18 DIAGNOSIS — J0120 Acute ethmoidal sinusitis, unspecified: Secondary | ICD-10-CM

## 2019-12-18 DIAGNOSIS — R29818 Other symptoms and signs involving the nervous system: Secondary | ICD-10-CM | POA: Diagnosis not present

## 2019-12-18 MED ORDER — AMOXICILLIN-POT CLAVULANATE 875-125 MG PO TABS: 1.0000 | ORAL_TABLET | Freq: Two times a day (BID) | ORAL | 0 refills | Status: DC

## 2019-12-18 MED ORDER — GADOBUTROL 1 MMOL/ML IV SOLN
10.0000 mL | Freq: Once | INTRAVENOUS | Status: AC | PRN
  Administered 2019-12-18: 10 mL via INTRAVENOUS

## 2019-12-18 NOTE — Progress Notes (Signed)
Bill,   No acute changes or worrisome findings in MRI. You do have some sinusitis of ethmoid sinusitis. I will send over augmentin for 10 days.

## 2019-12-21 ENCOUNTER — Other Ambulatory Visit: Payer: Self-pay

## 2019-12-21 ENCOUNTER — Ambulatory Visit (INDEPENDENT_AMBULATORY_CARE_PROVIDER_SITE_OTHER): Payer: 59 | Admitting: Family Medicine

## 2019-12-21 VITALS — BP 132/89 | HR 69

## 2019-12-21 DIAGNOSIS — D510 Vitamin B12 deficiency anemia due to intrinsic factor deficiency: Secondary | ICD-10-CM

## 2019-12-21 MED ORDER — CYANOCOBALAMIN 1000 MCG/ML IJ SOLN
1000.0000 ug | Freq: Once | INTRAMUSCULAR | Status: AC
  Administered 2019-12-21: 1000 ug via INTRAMUSCULAR

## 2019-12-21 NOTE — Progress Notes (Signed)
Medical screening examination/treatment was performed by qualified clinical staff member and as supervising physician I was immediately available for consultation/collaboration. I have reviewed documentation and agree with assessment and plan.  Shanikqua Zarzycki, DO  

## 2019-12-21 NOTE — Progress Notes (Signed)
Patient is here to start vitamin B12 injections. B12 injection to left deltoid with no apparent complications. Patient advised to schedule next injection in 14 days.

## 2020-01-04 ENCOUNTER — Ambulatory Visit (INDEPENDENT_AMBULATORY_CARE_PROVIDER_SITE_OTHER): Payer: 59 | Admitting: Family Medicine

## 2020-01-04 ENCOUNTER — Other Ambulatory Visit: Payer: Self-pay

## 2020-01-04 VITALS — BP 135/97 | HR 66 | Ht 71.0 in | Wt 243.0 lb

## 2020-01-04 DIAGNOSIS — D510 Vitamin B12 deficiency anemia due to intrinsic factor deficiency: Secondary | ICD-10-CM

## 2020-01-04 MED ORDER — CYANOCOBALAMIN 1000 MCG/ML IJ SOLN
1000.0000 ug | Freq: Once | INTRAMUSCULAR | Status: AC
  Administered 2020-01-04: 1000 ug via INTRAMUSCULAR

## 2020-01-04 NOTE — Progress Notes (Signed)
Patient is here for a vitamin B12 injection. Denies gastrointestinal problems or dizziness. B12 injection to right deltoid with no apparent complications. He states he has felt more energy, but still having numbness in hands/feet. He has a follow up appointment with [name] on Monday. Patient advised to schedule next injection in 30 days.

## 2020-01-04 NOTE — Progress Notes (Signed)
Agree with documentation as above.   Jennings Corado, MD  

## 2020-01-04 NOTE — Addendum Note (Signed)
Addended by: Clemetine Marker A on: 01/04/2020 03:15 PM   Modules accepted: Orders

## 2020-01-08 ENCOUNTER — Ambulatory Visit (INDEPENDENT_AMBULATORY_CARE_PROVIDER_SITE_OTHER): Payer: 59 | Admitting: Physician Assistant

## 2020-01-08 ENCOUNTER — Ambulatory Visit (INDEPENDENT_AMBULATORY_CARE_PROVIDER_SITE_OTHER): Payer: 59

## 2020-01-08 ENCOUNTER — Other Ambulatory Visit: Payer: Self-pay

## 2020-01-08 VITALS — BP 122/78 | HR 78 | Ht 71.0 in | Wt 245.0 lb

## 2020-01-08 DIAGNOSIS — D510 Vitamin B12 deficiency anemia due to intrinsic factor deficiency: Secondary | ICD-10-CM

## 2020-01-08 DIAGNOSIS — M542 Cervicalgia: Secondary | ICD-10-CM

## 2020-01-08 DIAGNOSIS — G43001 Migraine without aura, not intractable, with status migrainosus: Secondary | ICD-10-CM | POA: Diagnosis not present

## 2020-01-08 DIAGNOSIS — G479 Sleep disorder, unspecified: Secondary | ICD-10-CM

## 2020-01-08 DIAGNOSIS — R200 Anesthesia of skin: Secondary | ICD-10-CM

## 2020-01-08 DIAGNOSIS — R202 Paresthesia of skin: Secondary | ICD-10-CM

## 2020-01-08 LAB — VITAMIN B12: Vitamin B-12: 1038 pg/mL (ref 200–1100)

## 2020-01-08 MED ORDER — SUMATRIPTAN SUCCINATE 100 MG PO TABS: 100.0000 mg | ORAL_TABLET | ORAL | 0 refills | Status: DC | PRN

## 2020-01-08 NOTE — Progress Notes (Signed)
Subjective:    Patient ID: Jeffery Anthony, male    DOB: [birth year], 69 y.o.   MRN: [id number]  HPI  Pt is a 69 yo male who presents to the clinic with his wife for his 1 month follow up on dizziness, orthostatic hypotension, bilateral hand numbness and tingling, headaches, and pernicious anemia.  He does feel like he has more energy and motivation since last visit. He has had 2 b12 shots after finding out he was b12 deficient.   He had a normal MRI, no anemia, no inflammation, labs looked great. He was in pre-diabetes.   He continues to have numbness and tingling in both hands. His hands feels "tight and swollen". He does feel like it is worse in the morning. He has noticed this progressively for last year.   His dizziness is better. Not had the dizziness when standing recently.  He did fall once but it was in the middle of the night over a shoe.   He does not sleep well. He goes to sleep fine but then wakes up and cannot go back to sleep. He wakes up tired a lot of mornings. He has not tried anything to help him sleep.   He continues to have headaches. He has 3 different kinds of headaches. One that comes up the back of his neck, on that is pinpoint over his right eye and one that feels "deep in center of head". He is having one of these 5 out of 7 days a week. Usually laying with ice pack and sleeping helps to get rid of them. No vision changes, no upper ext weakness, no dizziness. He does get nauseated and sensitive to light and sound.   .. Active Ambulatory Problems    Diagnosis Date Noted  . Primary osteoarthritis of both knees 07/10/2013  . Colon polyp 10/04/2013  . Migraine 12/01/2016  . 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 Benign paroxysmal positional vertigo of right ear 11/17/2017  . Primary osteoarthritis of left knee 09/16/2018  . Status post left knee replacement 12/28/2018  . Low libido 12/28/2018  . ED (erectile dysfunction) of organic  origin 12/28/2018  . Urinary frequency 12/28/2018  . Pain in both hands 05/15/2019  . Numbness and tingling in both hands 05/15/2019  . Paresthesia of both hands 05/15/2019  . Orthostatic hypotension 12/11/2019  . Right arm pain 12/11/2019  . Family history of TIAs 12/11/2019  . Episode of dizziness 12/11/2019  . Memory changes 12/11/2019  . History of COVID-19 12/11/2019  . Word finding difficulty 12/11/2019  . Numbness around mouth 12/11/2019  . Hyperlipidemia LDL goal <100 12/12/2019  . B12 deficiency 12/12/2019  . Pre-diabetes 12/13/2019  . Trouble in sleeping 01/10/2020  . Neck pain 01/10/2020  . Pernicious anemia 01/10/2020   Resolved Ambulatory Problems    Diagnosis Date Noted  . No Resolved Ambulatory Problems   Past Medical History:  Diagnosis Date  . Arthritis   . GERD (gastroesophageal reflux disease)   . Headache       Review of Systems See HPI.     Objective:   Physical Exam Vitals reviewed.  Constitutional:      Appearance: Normal appearance.  HENT:     Head: Normocephalic.  Neck:     Vascular: No carotid bruit.  Cardiovascular:     Rate and Rhythm: Normal rate and regular rhythm.  Pulmonary:     Effort: Pulmonary effort is normal.     Breath sounds: Normal breath sounds.  Musculoskeletal:     Right lower leg: No edema.     Left lower leg: No edema.     Comments: 5/5 strength bilateral upper and lower extremity.  Negative tinels.  Numbness and tingling noted with phalens, bilaterally.   Lymphadenopathy:     Cervical: No cervical adenopathy.  Neurological:     Mental Status: He is alert and oriented to person, place, and time.     Motor: No weakness.     Comments: He did have some word finding difficulty in office.   Psychiatric:        Mood and Affect: Mood normal.           Assessment & Plan:  Marland KitchenMarland KitchenAbraham was seen today for follow-up.  Diagnoses and all orders for this visit:  Pernicious anemia -     B12  Neck pain -     DG  Cervical Spine Complete  Migraine without aura and with status migrainosus, not intractable -     SUMAtriptan (IMITREX) 100 MG tablet; Take 1 tablet (100 mg total) by mouth every 2 (two) hours as needed for migraine. May repeat in 2 hours if headache persists or recurs.  Trouble in sleeping  Numbness and tingling in both hands -     NCV with EMG(electromyography)   Will check b12 today to make sure in normal range. Follow up with monthly injections.   Evaluate bilateral numbness and tingling with EMG and cervical neck xray. Suspect carpal tunnel.   Headaches sound mostly like migraines. Sent Imitrex for rescue. Will avoid a preventative right now but if rescue helping could consider in the future.   Discussed good sleep importance and routine. Discussed belsomnra. HO given. Wants to avoid for right now.    Follow up in 2 months orsooner if worsening or no improvement.

## 2020-01-08 NOTE — Patient Instructions (Signed)
Get xray neck.  Will order EMG.  Recheck B12.    Suvorexant oral tablets What is this medicine? SUVOREXANT (su-vor-EX-ant) is used to treat insomnia. This medicine helps you to fall asleep and sleep through the night. This medicine may be used for other purposes; ask your health care provider or pharmacist if you have questions. COMMON BRAND NAME(S): Belsomra What should I tell my health care provider before I take this medicine? They need to know if you have any of these conditions:  depression  drink alcohol  drug abuse or addiction  feel sleepy or have fallen asleep suddenly during the day  history of a sudden onset of muscle weakness (cataplexy)  liver disease  lung or breathing disease, like asthma or emphysema  sleep apnea  suicidal thoughts, plans, or attempt; a previous suicide attempt by you or a family member  an unusual or allergic reaction to suvorexant, other medicines, foods, dyes, or preservatives  pregnant or trying to get pregnant  breast-feeding How should I use this medicine? Take this medicine by mouth within 30 minutes of going to bed. Do not take it unless you are able to stay in bed a full night before you must be active again. Follow the directions on the prescription label. You may take this medicine with or without a food. However, this medicine may take longer to work if you take it with or right after meals. Do not take your medicine more often than directed. Do not stop taking this medicine on your own. Always follow your doctor or health care professional's advice. A special MedGuide will be given to you by the pharmacist with each prescription and refill. Be sure to read this information carefully each time. Talk to your pediatrician regarding the use of this medicine in children. Special care may be needed. Overdosage: If you think you have taken too much of this medicine contact a poison control center or emergency room at once. NOTE: This  medicine is only for you. Do not share this medicine with others. What if I miss a dose? This medicine should only be taken immediately before going to sleep. Do not take double or extra doses. What may interact with this medicine?  alcohol  antihistamines for allergy, cough, or cold  aprepitant  boceprevir  certain antibiotics like ciprofloxacin, clarithromycin, erythromycin, telithromycin  certain antivirals for HIV or AIDS  certain medicines for anxiety or sleep  certain medicines for depression like amitriptyline, fluoxetine, nefazodone, sertraline  certain medicines for fungal infections like ketoconazole, posaconazole, fluconazole, itraconazole  certain medicines for seizures like carbamazepine, phenobarbital, primidone, phenytoin  conivaptan  digoxin  diltiazem  general anesthetics like halothane, isoflurane, methoxyflurane, propofol  grapefruit juice  imatinib  medicines that relax muscles for surgery  narcotic medicines for pain  phenothiazines like chlorpromazine, mesoridazine, prochlorperazine, thioridazine  rifampin  verapamil This list may not describe all possible interactions. Give your health care provider a list of all the medicines, herbs, non-prescription drugs, or dietary supplements you use. Also tell them if you smoke, drink alcohol, or use illegal drugs. Some items may interact with your medicine. What should I watch for while using this medicine? Visit your health care professional for regular checks on your progress. Tell your health care professional if your symptoms do not start to get better or if they get worse. Avoid caffeine-containing drinks in the evening hours. After taking this medicine, you may get up out of bed and do an activity that you do not know  you are doing. The next morning, you may have no memory of this. Activities include driving a car ("sleep-driving"), making and eating food, talking on the phone, sexual activity, and  sleep-walking. Serious injuries have occurred. Call your doctor right away if you find out you have done any of these activities. Do not take this medicine if you have used alcohol that evening. Do not take it if you have taken another medicine for sleep. Do not take this medicine unless you are able to stay in bed for a full night (7 to 8 hours) and do not drive or perform other activities requiring full alertness within 8 hours of a dose. Do not drive, use machinery, or do anything that needs mental alertness the day after you take the 20 mg dose of this medicine. The use of lower doses (10 mg) may also cause driving impairment the next day. You may have a decrease in mental alertness the day after use, even if you feel that you are fully awake. Tell your doctor if you will need to perform activities requiring full alertness, such as driving, the next day. Do not stand or sit up quickly after taking this medicine, especially if you are an older patient. This reduces the risk of dizzy or fainting spells. If you or your family notice any changes in your behavior, such as new or worsening depression, thoughts of harming yourself, anxiety, other unusual or disturbing thoughts, or memory loss, call your health care professional right away. After you stop taking this medicine, you may have trouble falling asleep. This is called rebound insomnia. This problem usually goes away on its own after 1 or 2 nights. What side effects may I notice from receiving this medicine? Side effects that you should report to your doctor or health care professional as soon as possible:  allergic reactions like skin rash, itching or hives, swelling of the face, lips, or tongue  hallucinations  periods of leg weakness lasting from seconds to a few minutes  suicidal thoughts, mood changes  unable to move or speak for several minutes while going to sleep or waking up  unusual activities while not fully awake like driving,  eating, making phone calls, or sexual activity Side effects that usually do not require medical attention (report these to your doctor or health care professional if they continue or are bothersome):  daytime drowsiness  headache  nightmares or abnormal dreams  tiredness This list may not describe all possible side effects. Call your doctor for medical advice about side effects. You may report side effects to FDA at 1-800-FDA-1088. Where should I keep my medicine? Keep out of the reach of children. This medicine can be abused. Keep your medicine in a safe place to protect it from theft. Do not share this medicine with anyone. Selling or giving away this medicine is dangerous and against the law. Store at room temperature between 15 and 30 degrees C (59 and 86 degrees F). Throw away any unused medicine after the expiration date. NOTE: This sheet is a summary. It may not cover all possible information. If you have questions about this medicine, talk to your doctor, pharmacist, or health care provider.  2020 Elsevier/Gold Standard (2018-10-28 16:37:12)

## 2020-01-08 NOTE — Progress Notes (Signed)
Xray of neck showed no acute findings and normal alignment. I do not suspect this is the cause of bilateral arm numbness.

## 2020-01-09 NOTE — Progress Notes (Signed)
Bill,  B12 is perfect. Ok to resume monthly B12 shots. I had mentioned coming in 2 weeks for another but lets keep it at 1 month.

## 2020-01-10 ENCOUNTER — Encounter: Payer: Self-pay | Admitting: Physician Assistant

## 2020-01-10 DIAGNOSIS — D510 Vitamin B12 deficiency anemia due to intrinsic factor deficiency: Secondary | ICD-10-CM | POA: Insufficient documentation

## 2020-01-10 DIAGNOSIS — G479 Sleep disorder, unspecified: Secondary | ICD-10-CM | POA: Insufficient documentation

## 2020-01-10 DIAGNOSIS — M542 Cervicalgia: Secondary | ICD-10-CM | POA: Insufficient documentation

## 2020-01-18 ENCOUNTER — Ambulatory Visit: Payer: 59

## 2020-02-05 ENCOUNTER — Ambulatory Visit: Payer: 59

## 2020-02-06 ENCOUNTER — Encounter: Payer: 59 | Admitting: Neurology

## 2020-02-06 ENCOUNTER — Other Ambulatory Visit: Payer: Self-pay

## 2020-02-06 ENCOUNTER — Ambulatory Visit (INDEPENDENT_AMBULATORY_CARE_PROVIDER_SITE_OTHER): Payer: 59 | Admitting: Neurology

## 2020-02-06 ENCOUNTER — Ambulatory Visit (INDEPENDENT_AMBULATORY_CARE_PROVIDER_SITE_OTHER): Payer: 59 | Admitting: Physician Assistant

## 2020-02-06 VITALS — BP 150/85 | HR 65 | Ht 71.0 in

## 2020-02-06 DIAGNOSIS — M542 Cervicalgia: Secondary | ICD-10-CM | POA: Diagnosis not present

## 2020-02-06 DIAGNOSIS — Z0289 Encounter for other administrative examinations: Secondary | ICD-10-CM

## 2020-02-06 DIAGNOSIS — R202 Paresthesia of skin: Secondary | ICD-10-CM | POA: Diagnosis not present

## 2020-02-06 DIAGNOSIS — R200 Anesthesia of skin: Secondary | ICD-10-CM | POA: Diagnosis not present

## 2020-02-06 DIAGNOSIS — D510 Vitamin B12 deficiency anemia due to intrinsic factor deficiency: Secondary | ICD-10-CM | POA: Diagnosis not present

## 2020-02-06 MED ORDER — CYANOCOBALAMIN 1000 MCG/ML IJ SOLN
1000.0000 ug | Freq: Once | INTRAMUSCULAR | Status: AC
  Administered 2020-02-06: 1000 ug via INTRAMUSCULAR

## 2020-02-06 NOTE — Progress Notes (Signed)
Patient presents to clinic for B12 injection. He denies any GI problems or dizziness while taking the medication. He tolerated the injection well in his left deltoid with no immediate complications. He was advised to follow up in 1 month for his next injection.

## 2020-02-06 NOTE — Progress Notes (Signed)
Full Name: Jonahtan Hira" Heffernan Gender: Male MRN #: [id number] Date of Birth: 1951/04/05    Visit Date: 02/06/2020 07:41 Age: 69 Years Examining Physician: Arlice Colt, MD  Referring Physician: Iran Planas, PA Height: 5 feet 11 inch    History:  Mr. Corns is a 69 year old man with right arm pain, numbness and weakness and left hand pain/numbness.  According to notes, he has a history of postvaccination Guillain-Barr syndrome.  Nerve conduction studies: The left median and both ulnar motor responses had normal distal latencies, amplitudes and conduction velocities.  The right median motor response appeared to have a reduced amplitude but there was a significant contribution through the ulnar nerve implying a forearm anastomosis, a normal variant.  Ulnar F-wave responses were normal.  Bilateral ulnar and median sensory responses had normal peak latencies and amplitudes.  Electromyography: Needle EMG of selected muscles of both arms was performed.  There was mild chronic denervation in the left extensor digitorum communis muscle and some polyphasic motor units in the left flexor carpi ulnaris muscle.  Other muscles tested had normal motor unit morphology and recruitment.  There was no abnormal spontaneous activity in any of the muscles tested  Impression: This NCV/EMG study shows the following: 1.   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  There did not appear to be evidence of other radiculopathy. 2.   There was no evidence of ulnar or median mononeuropathies.  Mikhala Kenan A. Felecia Shelling, MD, PhD, FAAN Certified in Neurology, Clinical Neurophysiology, Sleep Medicine, Pain Medicine and Neuroimaging Director, [place] [facility] [address] [place], [place] [address] [phone]       [facility]    Nerve / Sites Muscle Latency Ref. Amplitude Ref. Rel Amp Segments  Distance Velocity Ref. Area    ms ms mV mV %  cm m/s m/s mVms  R Median - APB     Wrist APB 3.6 ?4.4 3.7 ?4.0 100 Wrist - APB 7   20.2     Upper arm APB 8.5  4.0  109 Upper arm - Wrist 25 51 ?49 16.6     Ulnar Wrist APB 2.9  4.5  111 Ulnar Wrist - APB    17.5  L Median - APB     Wrist APB 3.1 ?4.4 7.1 ?4.0 100 Wrist - APB 7   26.0     Upper arm APB 7.8  7.0  98.9 Upper arm - Wrist 25 54 ?49 25.1  R Ulnar - ADM     Wrist ADM 2.6 ?3.3 9.5 ?6.0 100 Wrist - ADM 7   27.3     B.Elbow ADM 6.7  7.8  81.9 B.Elbow - Wrist 23 56 ?49 23.6     A.Elbow ADM 8.4  7.6  97 A.Elbow - B.Elbow 10 59 ?49 23.4  L Ulnar - ADM     Wrist ADM 2.5 ?3.3 9.5 ?6.0 100 Wrist - ADM 7   29.6     B.Elbow ADM 6.7  6.6  69.7 B.Elbow - Wrist 23 55 ?49 22.9     A.Elbow ADM 8.3  6.3  95.2 A.Elbow - B.Elbow 10 63 ?49 22.1             SNC    Nerve / Sites Rec. Site Peak Lat Ref.  Amp Ref. Segments Distance Peak Diff Ref.    ms ms V V  cm ms ms  R  Median, Ulnar - Transcarpal comparison     Median Palm Wrist 2.1 ?2.2 41 ?35 Median Palm - Wrist 8       Ulnar Palm Wrist 1.9 ?2.2 18 ?12 Ulnar Palm - Wrist 8          Median Palm - Ulnar Palm  0.2 ?0.4  L Median, Ulnar - Transcarpal comparison     Median Palm Wrist 2.1 ?2.2 60 ?35 Median Palm - Wrist 8       Ulnar Palm Wrist 1.9 ?2.2 14 ?12 Ulnar Palm - Wrist 8          Median Palm - Ulnar Palm  0.2 ?0.4  R Median - Orthodromic (Dig II, Mid palm)     Dig II Wrist 3.3 ?3.4 10 ?10 Dig II - Wrist 13    L Median - Orthodromic (Dig II, Mid palm)     Dig II Wrist 3.3 ?3.4 11 ?10 Dig II - Wrist 13    R Ulnar - Orthodromic, (Dig V, Mid palm)     Dig V Wrist 2.8 ?3.1 8 ?5 Dig V - Wrist 11    L Ulnar - Orthodromic, (Dig V, Mid palm)     Dig V Wrist 2.9 ?3.1 7 ?5 Dig V - Wrist 68                   F  Wave    Nerve F Lat Ref.   ms ms  R Ulnar - ADM 29.6 ?32.0  L Ulnar - ADM 30.5 ?32.0         EMG Summary Table    Spontaneous MUAP Recruitment  Muscle IA Fib PSW Fasc Other Amp  Dur. Poly Pattern  L. Deltoid Normal None None None _______ Normal Normal Normal Normal  L. Biceps brachii Normal None None None _______ Normal Normal Normal Normal  L. Triceps brachii Normal None None None _______ Normal Normal Normal Normal  L. Extensor digitorum communis Normal None None None _______ Normal Normal 1+ Reduced  L. Flexor carpi ulnaris Normal None None None _______ Normal Normal 1+ Normal  L. First dorsal interosseous Normal None None None _______ Normal Normal Normal Normal  L. Abductor pollicis brevis Normal None None None _______ Normal Normal Normal Normal  R. Deltoid Normal None None None _______ Normal Normal Normal Normal  R. Triceps brachii Normal None None None _______ Normal Normal Normal Normal  R. Biceps brachii Normal None None None _______ Normal Normal Normal Normal  R. Extensor digitorum communis Normal None None None _______ Normal Normal Normal Normal  R. Flexor carpi ulnaris Normal None None None _______ Normal Normal Normal Normal  R. First dorsal interosseous Normal None None None _______ Normal Normal Normal Normal

## 2020-02-07 ENCOUNTER — Telehealth: Payer: Self-pay | Admitting: Physician Assistant

## 2020-02-07 DIAGNOSIS — M5412 Radiculopathy, cervical region: Secondary | ICD-10-CM | POA: Insufficient documentation

## 2020-02-07 NOTE — Telephone Encounter (Signed)
Patient made aware. Agreeable to appt with Dr. Darene Lamer. Transferred to the front desk to schedule.

## 2020-02-07 NOTE — Telephone Encounter (Signed)
EMG showed left C7 radiculopathy coming from neck. This is likely where you left numbness and tingling is coming from. If numbness and tingling is persistent I would like for you to see Dr. Darene Lamer here in office to look more in to musculoskeletal treatments.

## 2020-02-07 NOTE — Progress Notes (Signed)
Patient ID: Jeffery Anthony, male   DOB: 03/22/1951, 68 y.o.   MRN: [id number] Agree with above plan.  

## 2020-02-12 ENCOUNTER — Ambulatory Visit: Payer: 59 | Admitting: Sports Medicine

## 2020-02-14 ENCOUNTER — Other Ambulatory Visit: Payer: Self-pay

## 2020-02-14 ENCOUNTER — Ambulatory Visit (INDEPENDENT_AMBULATORY_CARE_PROVIDER_SITE_OTHER): Payer: 59 | Admitting: Sports Medicine

## 2020-02-14 ENCOUNTER — Encounter: Payer: Self-pay | Admitting: Sports Medicine

## 2020-02-14 DIAGNOSIS — R200 Anesthesia of skin: Secondary | ICD-10-CM

## 2020-02-14 DIAGNOSIS — R202 Paresthesia of skin: Secondary | ICD-10-CM

## 2020-02-14 DIAGNOSIS — G5603 Carpal tunnel syndrome, bilateral upper limbs: Secondary | ICD-10-CM | POA: Diagnosis not present

## 2020-02-14 MED ORDER — PREDNISONE 50 MG PO TABS: ORAL_TABLET | ORAL | 0 refills | Status: DC

## 2020-02-14 NOTE — Assessment & Plan Note (Signed)
Jeffery Anthony is a pleasant 69 year old male, he has a complex history of vitamin B12 deficiency, corrected, he felt significantly better.  He did have some cognitive dysfunction, ataxia, all of which resolved. Unfortunately his paresthesias did not resolve. He endorses them going from the shoulders down to the hands bilaterally, he is not certain as to which distribution, he does endorse all of his fingers. On further questioning he is dropping things, it is worse in the morning and at night, he did have a nerve conduction study and EMG that showed a unilateral C7 radiculitis, however nerve conduction and EMG studies do not have great sensitivity or specificity. He also had an x-ray, I personally reviewed the images which show significant C5-C6 and C6-C7 degenerative disc disease with a loss of the normal cervical lordosis. Thus his symptoms could be radicular or related to a peripheral entrapment neuropathy such as carpal tunnel syndrome, he did have positive Tinel's sign on exam today. We are to start with treating this as a carpal tunnel syndrome considering the distribution of his paresthesias, bilateral nighttime splinting, thumb spica on the left considering some thumb basal joint arthritis. 5 days of prednisone, physical therapy for his neck and hands, return to see me in 4 weeks, we will consider median nerve Hydro dissections if no better. If this does not work then we will target his cervical spine.

## 2020-02-14 NOTE — Progress Notes (Signed)
    Procedures performed today:    None.  Independent interpretation of notes and tests performed by another provider:   He also had an x-ray, I personally reviewed the images which show significant C5-C6 and C6-C7 degenerative disc disease with a loss of the normal cervical lordosis.  Brief History, Exam, Impression, and Recommendations:    Numbness and tingling in both hands Jeffery Anthony is a pleasant 69 year old male, he has a complex history of vitamin B12 deficiency, corrected, he felt significantly better.  He did have some cognitive dysfunction, ataxia, all of which resolved. Unfortunately his paresthesias did not resolve. He endorses them going from the shoulders down to the hands bilaterally, he is not certain as to which distribution, he does endorse all of his fingers. On further questioning he is dropping things, it is worse in the morning and at night, he did have a nerve conduction study and EMG that showed a unilateral C7 radiculitis, however nerve conduction and EMG studies do not have great sensitivity or specificity. He also had an x-ray, I personally reviewed the images which show significant C5-C6 and C6-C7 degenerative disc disease with a loss of the normal cervical lordosis. Thus his symptoms could be radicular or related to a peripheral entrapment neuropathy such as carpal tunnel syndrome, he did have positive Tinel's sign on exam today. We are to start with treating this as a carpal tunnel syndrome considering the distribution of his paresthesias, bilateral nighttime splinting, thumb spica on the left considering some thumb basal joint arthritis. 5 days of prednisone, physical therapy for his neck and hands, return to see me in 4 weeks, we will consider median nerve Hydro dissections if no better. If this does not work then we will target his cervical spine.    ___________________________________________ Gwen Her. Dianah Field, M.D., ABFM., CAQSM. Primary Care and [place] Instructor of [place] [facility]

## 2020-03-04 ENCOUNTER — Ambulatory Visit: Payer: 59 | Admitting: Physician Assistant

## 2020-03-07 ENCOUNTER — Ambulatory Visit (INDEPENDENT_AMBULATORY_CARE_PROVIDER_SITE_OTHER): Payer: 59 | Admitting: Nurse Practitioner

## 2020-03-07 ENCOUNTER — Other Ambulatory Visit: Payer: Self-pay

## 2020-03-07 VITALS — BP 134/79 | HR 60 | Wt 240.0 lb

## 2020-03-07 DIAGNOSIS — D510 Vitamin B12 deficiency anemia due to intrinsic factor deficiency: Secondary | ICD-10-CM

## 2020-03-07 MED ORDER — CYANOCOBALAMIN 1000 MCG/ML IJ SOLN
1000.0000 ug | Freq: Once | INTRAMUSCULAR | Status: AC
  Administered 2020-03-07: 1000 ug via INTRAMUSCULAR

## 2020-03-07 NOTE — Progress Notes (Signed)
Established Patient Office Visit  Subjective:  Patient ID: Jeffery Anthony, male    DOB: 07/24/1951  Age: 69 y.o. MRN: [id number]  CC:  Chief Complaint  Patient presents with  . Pernicious Anemia    HPI Jeffery Anthony is here for a vitamin B 12 injection. Denies muscle cramps, weakness or irregular heart rate.   Covid vaccine - Due to Guillain-Barre syndrome patient states he is unable to receive the COVID-19 vaccine.  Noted:  07/07/2017  Overview Signed 07/07/2017 10:01 PM by Donella Stade, PA-C  Swine flu vaccination. Pt does not wish to take any other vaccines      Past Medical History:  Diagnosis Date  . Arthritis    knees  . GERD (gastroesophageal reflux disease)   . Headache    migraines    Past Surgical History:  Procedure Laterality Date  . KNEE ARTHROSCOPY Bilateral 1992, 2008  . ruptured achilles tendon Left 1992  . TONSILLECTOMY AND ADENOIDECTOMY  1961  . 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 Hyperlipidemia Father   . Hypertension Father   . Heart attack Father   . Colon cancer Neg Hx     Social History   Socioeconomic History  . Marital status: Married    Spouse name: Not on file  . Number of children: Not on file  . Years of education: Not on file  . Highest education level: Not on file  Occupational History  . Not on file  Tobacco Use  . Smoking status: Never Smoker  . Smokeless tobacco: Never Used  Substance and Sexual Activity  . Alcohol use: Yes    Alcohol/week: 1.0 standard drinks    Types: 1 Glasses of wine per week  . Drug use: No  . Sexual activity: Yes  Other Topics Concern  . Not on file  Social History Narrative  . Not on file   Social Determinants of Health   Financial Resource Strain:   . Difficulty of Paying Living Expenses:   Food  Insecurity:   . Worried About Charity fundraiser in the Last Year:   . Arboriculturist in the Last Year:   Transportation Needs:   . Film/video editor (Medical):   Marland Kitchen Lack of Transportation (Non-Medical):   Physical Activity:   . Days of Exercise per Week:   . Minutes of Exercise per Session:   Stress:   . Feeling of Stress :   Social Connections:   . Frequency of Communication with Friends and Family:   . Frequency of Social Gatherings with Friends and Family:   . Attends Religious Services:   . Active Member of Clubs or Organizations:   . Attends Archivist Meetings:   Marland Kitchen Marital Status:   Intimate Partner Violence:   . Fear of Current or Ex-Partner:   . Emotionally Abused:   Marland Kitchen Physically Abused:   . Sexually Abused:     Outpatient Medications Prior to Visit  Medication Sig Dispense Refill  . aspirin EC 81 MG tablet Take 81 mg by mouth daily.    Marland Kitchen aspirin-acetaminophen-caffeine (EXCEDRIN EXTRA STRENGTH) 250-250-65 MG tablet Take 1 tablet by mouth every 6 (six) hours as needed for migraine.    . SUMAtriptan (IMITREX) 100 MG tablet Take 1 tablet (100 mg total) by mouth  every 2 (two) hours as needed for migraine. May repeat in 2 hours if headache persists or recurs. 10 tablet 0  . vitamin B-12 (CYANOCOBALAMIN) 1000 MCG tablet Take 1,000 mcg by mouth daily.    Marland Kitchen atorvastatin (LIPITOR) 40 MG tablet Take 1 tablet (40 mg total) by mouth daily. (Patient not taking: Reported on 03/07/2020) 90 tablet 3  . predniSONE (DELTASONE) 50 MG tablet One tab PO daily for 5 days. 5 tablet 0  . sildenafil (VIAGRA) 100 MG tablet Take 1 tablet (100 mg total) by mouth daily as needed for erectile dysfunction. 10 tablet 0   No facility-administered medications prior to visit.    Allergies  Allergen Reactions  . Iodinated Diagnostic Agents Nausea And Vomiting and Other (See Comments)    iodine dye    ROS Review of Systems    Objective:    Physical Exam  BP 134/79   Pulse 60    Wt 240 lb (108.9 kg)   SpO2 96%   BMI 33.47 kg/m  Wt Readings from Last 3 Encounters:  03/07/20 240 lb (108.9 kg)  01/08/20 245 lb (111.1 kg)  01/04/20 243 lb (110.2 kg)     Health Maintenance Due  Topic Date Due  . COVID-19 Vaccine (1) Never done    There are no preventive care reminders to display for this patient.  Lab Results  Component Value Date   TSH 2.54 12/11/2019   Lab Results  Component Value Date   WBC 4.8 12/11/2019   HGB 14.1 12/11/2019   HCT 39.7 12/11/2019   MCV 116.8 (H) 12/11/2019   PLT 224 12/11/2019   Lab Results  Component Value Date   NA 143 12/11/2019   K 4.4 12/11/2019   CO2 29 12/11/2019   GLUCOSE 104 (H) 12/11/2019   BUN 17 12/11/2019   CREATININE 1.10 12/11/2019   BILITOT 0.7 12/11/2019   ALKPHOS 44 09/09/2018   AST 15 12/11/2019   ALT 17 12/11/2019   PROT 6.6 12/11/2019   ALBUMIN 4.4 09/09/2018   CALCIUM 9.7 12/11/2019   ANIONGAP 7 09/09/2018   Lab Results  Component Value Date   CHOL 195 12/11/2019   Lab Results  Component Value Date   HDL 43 12/11/2019   Lab Results  Component Value Date   LDLCALC 127 (H) 12/11/2019   Lab Results  Component Value Date   TRIG 141 12/11/2019   Lab Results  Component Value Date   CHOLHDL 4.5 12/11/2019   Lab Results  Component Value Date   HGBA1C 5.9 (H) 12/11/2019      Assessment & Plan:  Pernicious anemia - Patient tolerated injection well without complications. Patient advised to schedule next injection 30 days from today.    Problem List Items Addressed This Visit    Pernicious anemia - Primary      Meds ordered this encounter  Medications  . cyanocobalamin ((VITAMIN B-12)) injection 1,000 mcg    Follow-up: Return in about 4 weeks (around 04/04/2020) for B12 injection. [name], [name], CMA   Agree with assessment and plan provided by CMA. Worthy Keeler, DNP, AGNP-c

## 2020-03-11 ENCOUNTER — Encounter: Payer: Self-pay | Admitting: Physician Assistant

## 2020-03-11 ENCOUNTER — Telehealth (INDEPENDENT_AMBULATORY_CARE_PROVIDER_SITE_OTHER): Payer: 59 | Admitting: Physician Assistant

## 2020-03-11 VITALS — Ht 71.0 in | Wt 234.0 lb

## 2020-03-11 DIAGNOSIS — E785 Hyperlipidemia, unspecified: Secondary | ICD-10-CM | POA: Diagnosis not present

## 2020-03-11 DIAGNOSIS — R202 Paresthesia of skin: Secondary | ICD-10-CM | POA: Diagnosis not present

## 2020-03-11 DIAGNOSIS — R7303 Prediabetes: Secondary | ICD-10-CM

## 2020-03-11 DIAGNOSIS — E538 Deficiency of other specified B group vitamins: Secondary | ICD-10-CM

## 2020-03-11 NOTE — Progress Notes (Signed)
Patient ID: Jeffery Anthony, male   DOB: 1951/02/19, 69 y.o.   MRN: [id number] .Marland KitchenVirtual Visit via Video Note  I connected with Loralyn Freshwater on 03/11/20 at  8:30 AM EDT by a video enabled telemedicine application and verified that I am speaking with the correct person using two identifiers.  Location: Patient: home Provider: home   I discussed the limitations of evaluation and management by telemedicine and the availability of in person appointments. The patient expressed understanding and agreed to proceed.  History of Present Illness: Pt is a 69 yo male with b12 deficiency and bilateral hand numbness who calls into the clinic for follow up.   He is seeing Dr. Darene Lamer for bilateral hand numbness. He is finishing 3rd week of PT with no improvement. Dr. Darene Lamer in notes will likely do hydrodissection at next visit and continuing to treat accordingly.   He denies any other problems or concerns. Memory and cognitive function is great. He is tolerating all medications.   .. Active Ambulatory Problems    Diagnosis Date Noted  . Primary osteoarthritis of both knees 07/10/2013  . Colon polyp 10/04/2013  . Migraine 12/01/2016  . 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 Benign paroxysmal positional vertigo of right ear 11/17/2017  . Primary osteoarthritis of left knee 09/16/2018  . Status post left knee replacement 12/28/2018  . Low libido 12/28/2018  . 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  . Urinary frequency 12/28/2018  . Pain in both hands 05/15/2019  . Numbness and tingling in both hands 05/15/2019  . Paresthesia of both hands 05/15/2019  . Orthostatic hypotension 12/11/2019  . Right arm pain 12/11/2019  . Family history of TIAs 12/11/2019  . Episode of dizziness 12/11/2019  . Memory changes 12/11/2019  . History of COVID-19 12/11/2019  . Word finding difficulty 12/11/2019  . Numbness around mouth 12/11/2019  . Hyperlipidemia LDL goal <100  12/12/2019  . B12 deficiency 12/12/2019  . Pre-diabetes 12/13/2019  . Trouble in sleeping 01/10/2020  . Neck pain 01/10/2020  . Pernicious anemia 01/10/2020  . C7 radiculopathy 02/07/2020   Resolved Ambulatory Problems    Diagnosis Date Noted  . No Resolved Ambulatory Problems   Past Medical History:  Diagnosis Date  . Arthritis   . GERD (gastroesophageal reflux disease)   . Headache    Reviewed med, allergy, problem list.     Observations/Objective: No acute distress Normal mood and appearance.   .. Today's Vitals   03/11/20 0818  Weight: 234 lb (106.1 kg)  Height: 5\' 11"  (1.803 m)   Body mass index is 32.64 kg/m.    Assessment and Plan: Marland KitchenMarland KitchenJahaire was seen today for follow-up.  Diagnoses and all orders for this visit:  B12 deficiency  Paresthesia of both hands  Hyperlipidemia LDL goal <100  Pre-diabetes   Needs labs to be followed up on in September. Doing well and UTD on labs and refills. Pt is not on statin that was prescribed at last visit.   Continue to follow up with Dr. Darene Lamer for management of numbness or bilateral hands.     Follow Up Instructions:    I discussed the assessment and treatment plan with the patient. The patient was provided an opportunity to ask questions and all were answered. The patient agreed with the plan and demonstrated an understanding of the instructions.   The patient was advised to call back or seek an in-person evaluation if the symptoms worsen or if the condition fails to improve as anticipated.  I provided 15 minutes of non-face-to-face time during this encounter.   Iran Planas, PA-C

## 2020-03-11 NOTE — Progress Notes (Signed)
No more episodes like before Still having tingling in hands and pain in left wrist (saw Dr. Darene Lamer, in 3rd week of physical therapy)  No other issues, PHQ9-GAD7 completed.

## 2020-03-14 ENCOUNTER — Ambulatory Visit (INDEPENDENT_AMBULATORY_CARE_PROVIDER_SITE_OTHER): Payer: 59 | Admitting: Sports Medicine

## 2020-03-14 ENCOUNTER — Encounter: Payer: Self-pay | Admitting: Sports Medicine

## 2020-03-14 ENCOUNTER — Other Ambulatory Visit: Payer: Self-pay

## 2020-03-14 DIAGNOSIS — R202 Paresthesia of skin: Secondary | ICD-10-CM | POA: Diagnosis not present

## 2020-03-14 DIAGNOSIS — M5412 Radiculopathy, cervical region: Secondary | ICD-10-CM

## 2020-03-14 DIAGNOSIS — M79641 Pain in right hand: Secondary | ICD-10-CM

## 2020-03-14 DIAGNOSIS — R200 Anesthesia of skin: Secondary | ICD-10-CM | POA: Diagnosis not present

## 2020-03-14 NOTE — Progress Notes (Signed)
    Procedures performed today:    Procedure: Real-time Ultrasound Guided hydrodissection of the left median nerve at the carpal tunnel Device: Samsung HS60 Verbal informed consent obtained.  Time-out conducted.  Noted no overlying erythema, induration, or other signs of local infection.  Skin prepped in a sterile fashion.  Local anesthesia: Topical Ethyl chloride.  With sterile technique and under real time ultrasound guidance: Using a 25-gauge needle advanced into the carpal tunnel, taking care to avoid intraneural injection I injected medication both superficial to and deep to the median nerve freeing it from surrounding structures, I then redirected the needle deep and injected further medication around the flexor tendons deep within the carpal tunnel for a total of 1 cc kenalog 40, 5 cc 1% lidocaine without epinephrine. Completed without difficulty  Advised to call if fevers/chills, erythema, induration, drainage, or persistent bleeding.  Images permanently stored and available for review in the ultrasound unit.  Impression: Technically successful ultrasound guided median nerve hydrodissection.  Procedure: Real-time Ultrasound Guided injection of the left thumb basal joint Device: Samsung HS60  Verbal informed consent obtained.  Time-out conducted.  Noted no overlying erythema, induration, or other signs of local infection.  Skin prepped in a sterile fashion.  Local anesthesia: Topical Ethyl chloride.  With sterile technique and under real time ultrasound guidance:  1/2 cc lidocaine, 1/2 cc Kenalog 40 injected easily completed without difficulty  Pain immediately resolved suggesting accurate placement of the medication.  Advised to call if fevers/chills, erythema, induration, drainage, or persistent bleeding.  Images permanently stored and available for review in the ultrasound unit.  Impression: Technically successful ultrasound guided injection.  Independent interpretation of  notes and tests performed by another provider:   None.  Brief History, Exam, Impression, and Recommendations:    Numbness and tingling in both hands This is a pleasant 69 year old male, complex history of vitamin B12 deficiency, corrected, he has had paresthesias in both hands, at the last visit he had a positive Tinel's sign, he also had symptoms consistent with a radicular source running down his arm. He has been doing some physical therapy for his neck and his hands, as well as wearing nighttime splints. He improved considerably but still has a bit of clumsiness and paresthesias in his hands.  He did have one episode of recurrent radiculitis. Due to persistent numbness and tingling in his hands albeit better we are going to proceed with a left carpal tunnel median nerve hydrodissection, he does have some thumb basal joint pain so this was injected as well. If he responds well we will proceed at the next month with a right median nerve hydrodissection. 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    ___________________________________________ Jeffery Anthony. Jeffery Anthony, M.D., ABFM., CAQSM. Primary Care and [place] Instructor of [employer] [facility] [employer]

## 2020-03-14 NOTE — Assessment & Plan Note (Signed)
This is a pleasant 69 year old male, complex history of vitamin B12 deficiency, corrected, he has had paresthesias in both hands, at the last visit he had a positive Tinel's sign, he also had symptoms consistent with a radicular source running down his arm. He has been doing some physical therapy for his neck and his hands, as well as wearing nighttime splints. He improved considerably but still has a bit of clumsiness and paresthesias in his hands.  He did have one episode of recurrent radiculitis. Due to persistent numbness and tingling in his hands albeit better we are going to proceed with a left carpal tunnel median nerve hydrodissection, he does have some thumb basal joint pain so this was injected as well. If he responds well we will proceed at the next month with a right median nerve hydrodissection. 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

## 2020-04-10 ENCOUNTER — Other Ambulatory Visit: Payer: Self-pay

## 2020-04-10 ENCOUNTER — Telehealth: Payer: Self-pay | Admitting: *Deleted

## 2020-04-10 ENCOUNTER — Ambulatory Visit (INDEPENDENT_AMBULATORY_CARE_PROVIDER_SITE_OTHER): Payer: 59 | Admitting: Physician Assistant

## 2020-04-10 VITALS — BP 134/86 | HR 71 | Ht 71.0 in | Wt 231.0 lb

## 2020-04-10 DIAGNOSIS — E538 Deficiency of other specified B group vitamins: Secondary | ICD-10-CM | POA: Diagnosis not present

## 2020-04-10 MED ORDER — CYANOCOBALAMIN 1000 MCG/ML IJ SOLN
1000.0000 ug | Freq: Once | INTRAMUSCULAR | Status: AC
  Administered 2020-04-10: 1000 ug via INTRAMUSCULAR

## 2020-04-10 NOTE — Telephone Encounter (Signed)
Called pt and LVM advising him that he will NOT need any labs before his next B12 injection.   Spoke with Iran Planas PA-C and she stated that he only needed to do his monthly B12 injections since his labs looked good.   Advised that he can either RTN call or send a mychart message should he have questions.

## 2020-04-10 NOTE — Progress Notes (Signed)
Patient ID: Jeffery Anthony, male   DOB: [birth year], 69 y.o.   MRN: [id number] Agree with above plan.

## 2020-04-10 NOTE — Progress Notes (Signed)
Pt came in today for B12 injection. Injection given in LD tolerated well.  Pt reports no negative side effects from medication. Denies any dizziness, chest pain or palpitations, and no GI problems.. Pt to RTC in 1 month for next injection.

## 2020-04-12 ENCOUNTER — Other Ambulatory Visit: Payer: Self-pay

## 2020-04-12 ENCOUNTER — Ambulatory Visit (INDEPENDENT_AMBULATORY_CARE_PROVIDER_SITE_OTHER): Payer: 59 | Admitting: Sports Medicine

## 2020-04-12 DIAGNOSIS — R200 Anesthesia of skin: Secondary | ICD-10-CM

## 2020-04-12 DIAGNOSIS — R202 Paresthesia of skin: Secondary | ICD-10-CM | POA: Diagnosis not present

## 2020-04-12 NOTE — Progress Notes (Signed)
    Procedures performed today:    None.  Independent interpretation of notes and tests performed by another provider:   None.  Brief History, Exam, Impression, and Recommendations:    Numbness and tingling in both hands Jeffery Anthony is a pleasant 69 year old male, we have been seeing him for numbness and tingling in both hands, he does have a complex history of vitamin B12 deficiency that has been corrected, he had bilateral paresthesias but with a positive Tinel's sign mostly on the left, so at the last visit we did a left median nerve hydrodissection and a left thumb basal joint injection. He has had good improvement, he still has some paresthesias into the fingertips, but feels as though he can live with it, he wears his night splints, he did physical therapy. The neck step would be consideration of gabapentin or amitriptyline at bedtime, but for now no intervention is needed.    ___________________________________________ Gwen Her. Dianah Field, M.D., ABFM., CAQSM. Primary Care and [place] Instructor of [place] of [facility] of Medicine

## 2020-04-12 NOTE — Assessment & Plan Note (Signed)
Jeffery Anthony is a pleasant 69 year old male, we have been seeing him for numbness and tingling in both hands, he does have a complex history of vitamin B12 deficiency that has been corrected, he had bilateral paresthesias but with a positive Tinel's sign mostly on the left, so at the last visit we did a left median nerve hydrodissection and a left thumb basal joint injection. He has had good improvement, he still has some paresthesias into the fingertips, but feels as though he can live with it, he wears his night splints, he did physical therapy. The neck step would be consideration of gabapentin or amitriptyline at bedtime, but for now no intervention is needed.

## 2020-05-09 ENCOUNTER — Ambulatory Visit (INDEPENDENT_AMBULATORY_CARE_PROVIDER_SITE_OTHER): Payer: 59 | Admitting: Family Medicine

## 2020-05-09 VITALS — BP 123/64 | HR 70 | Wt 228.1 lb

## 2020-05-09 DIAGNOSIS — D510 Vitamin B12 deficiency anemia due to intrinsic factor deficiency: Secondary | ICD-10-CM | POA: Diagnosis not present

## 2020-05-09 MED ORDER — CYANOCOBALAMIN 1000 MCG/ML IJ SOLN
1000.0000 ug | Freq: Once | INTRAMUSCULAR | Status: AC
  Administered 2020-05-09: 1000 ug via INTRAMUSCULAR

## 2020-05-09 NOTE — Progress Notes (Signed)
Pt came in today for B12 injection. Injection given in RD tolerated well. Pt reports no negative side effects from medication. Denies any dizziness, chest pain or palpitations, and no GI problems. Pt to return in 1 month for next injection.

## 2020-05-09 NOTE — Progress Notes (Signed)
Agree with documentation as above.   Faizon Capozzi, MD  

## 2020-06-06 ENCOUNTER — Ambulatory Visit (INDEPENDENT_AMBULATORY_CARE_PROVIDER_SITE_OTHER): Payer: Medicare Other | Admitting: Nurse Practitioner

## 2020-06-06 ENCOUNTER — Encounter: Payer: Self-pay | Admitting: Neurology

## 2020-06-06 ENCOUNTER — Telehealth: Payer: Self-pay | Admitting: Neurology

## 2020-06-06 VITALS — BP 129/78 | HR 50

## 2020-06-06 DIAGNOSIS — D510 Vitamin B12 deficiency anemia due to intrinsic factor deficiency: Secondary | ICD-10-CM | POA: Diagnosis not present

## 2020-06-06 MED ORDER — CYANOCOBALAMIN 1000 MCG/ML IJ SOLN
1000.0000 ug | Freq: Once | INTRAMUSCULAR | Status: AC
  Administered 2020-06-06: 1000 ug via INTRAMUSCULAR

## 2020-06-06 NOTE — Telephone Encounter (Signed)
Yes this is an actual exemption.

## 2020-06-06 NOTE — Telephone Encounter (Signed)
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 Okay to write?

## 2020-06-06 NOTE — Telephone Encounter (Signed)
Letter written. Patient made aware. Will pick up from the front desk.

## 2020-06-06 NOTE — Progress Notes (Signed)
Patient is here for a vitamin B12 injection. Denies gastrointestinal problems or dizziness. B12 injection to left deltoid with no apparent complications. Patient advised to schedule next injection in 30 days.    Agree with above. Worthy Keeler, DNP, AGNP-c

## 2020-07-04 ENCOUNTER — Ambulatory Visit (INDEPENDENT_AMBULATORY_CARE_PROVIDER_SITE_OTHER): Payer: Medicare HMO | Admitting: Family Medicine

## 2020-07-04 ENCOUNTER — Encounter: Payer: Self-pay | Admitting: Neurology

## 2020-07-04 ENCOUNTER — Other Ambulatory Visit: Payer: Self-pay

## 2020-07-04 VITALS — BP 98/66 | HR 85

## 2020-07-04 DIAGNOSIS — D510 Vitamin B12 deficiency anemia due to intrinsic factor deficiency: Secondary | ICD-10-CM

## 2020-07-04 MED ORDER — CYANOCOBALAMIN 1000 MCG/ML IJ SOLN
1000.0000 ug | Freq: Once | INTRAMUSCULAR | Status: AC
  Administered 2020-07-04: 1000 ug via INTRAMUSCULAR

## 2020-07-04 NOTE — Progress Notes (Signed)
Message sent to patient letting him know

## 2020-07-04 NOTE — Progress Notes (Signed)
Patient is here for a vitamin B12 injection. Denies gastrointestinal problems or dizziness. B12 injection to right deltoid with no apparent complications. Patient advised to schedule next injection in 30 days.   

## 2020-07-04 NOTE — Addendum Note (Signed)
Addended byAnnamaria Helling on: 07/04/2020 09:57 AM   Modules accepted: Orders

## 2020-07-04 NOTE — Progress Notes (Signed)
Agree with documentation as above. Commend retest B12 right before his next injection in 1 month.  Beatrice Lecher, MD

## 2020-07-04 NOTE — Telephone Encounter (Signed)
Mychart message sent to patient.

## 2020-07-16 ENCOUNTER — Ambulatory Visit (INDEPENDENT_AMBULATORY_CARE_PROVIDER_SITE_OTHER): Payer: Medicare HMO | Admitting: Physician Assistant

## 2020-07-16 ENCOUNTER — Encounter: Payer: Self-pay | Admitting: Physician Assistant

## 2020-07-16 VITALS — BP 119/78 | HR 71 | Ht 71.0 in | Wt 212.0 lb

## 2020-07-16 DIAGNOSIS — L821 Other seborrheic keratosis: Secondary | ICD-10-CM | POA: Diagnosis not present

## 2020-07-16 DIAGNOSIS — D510 Vitamin B12 deficiency anemia due to intrinsic factor deficiency: Secondary | ICD-10-CM

## 2020-07-16 DIAGNOSIS — Z125 Encounter for screening for malignant neoplasm of prostate: Secondary | ICD-10-CM

## 2020-07-16 DIAGNOSIS — Z0000 Encounter for general adult medical examination without abnormal findings: Secondary | ICD-10-CM

## 2020-07-16 DIAGNOSIS — Z1211 Encounter for screening for malignant neoplasm of colon: Secondary | ICD-10-CM | POA: Diagnosis not present

## 2020-07-16 DIAGNOSIS — E7800 Pure hypercholesterolemia, unspecified: Secondary | ICD-10-CM

## 2020-07-16 DIAGNOSIS — R7303 Prediabetes: Secondary | ICD-10-CM

## 2020-07-16 NOTE — Progress Notes (Signed)
Subjective:    Patient ID: Jeffery Anthony, male    DOB: 10-29-1950, 69 y.o.   MRN: [id number]  HPI  Patient is a 69 year old male who presents to the clinic for complete physical.  Patient has worked really hard on his overall health for the last 6 months. He has lost over 30 pounds with exercise and diet. He is now retired and stress free. He is having little to no migraines. He did not start Lipitor because he wanted to try diet and exercise first. He would like to get his labs rechecked today.  He does have a few spots on his body he would like for [place] to look at today.  He did not get a colonoscopy last year due to cancellation during the Covid pandemic.  He is doing great with his B12 injections. He would like to learn to do them himself to avoid coming into the office monthly.  .. Active Ambulatory Problems    Diagnosis Date Noted  . Primary osteoarthritis of both knees 07/10/2013  . Colon polyp 10/04/2013  . Migraine 12/01/2016  . 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 Benign paroxysmal positional vertigo of right ear 11/17/2017  . Primary osteoarthritis of left knee 09/16/2018  . Status post left knee replacement 12/28/2018  . Low libido 12/28/2018  . 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  . Urinary frequency 12/28/2018  . Pain in both hands 05/15/2019  . Numbness and tingling in both hands 05/15/2019  . Paresthesia of both hands 05/15/2019  . Orthostatic hypotension 12/11/2019  . Right arm pain 12/11/2019  . Family history of TIAs 12/11/2019  . Episode of dizziness 12/11/2019  . Memory changes 12/11/2019  . History of COVID-19 12/11/2019  . Word finding difficulty 12/11/2019  . Numbness around mouth 12/11/2019  . Hyperlipidemia LDL goal <100 12/12/2019  . B12 deficiency 12/12/2019  . Pre-diabetes 12/13/2019  . Trouble in sleeping 01/10/2020  . Neck pain 01/10/2020  . Pernicious anemia 01/10/2020  . C7 radiculopathy  02/07/2020  . Elevated LDL cholesterol level 07/16/2020   Resolved Ambulatory Problems    Diagnosis Date Noted  . No Resolved Ambulatory Problems   Past Medical History:  Diagnosis Date  . Arthritis   . GERD (gastroesophageal reflux disease)   . Headache    .Marland Kitchen Family History  Problem Relation Age of Onset  . Hyperlipidemia Father   . Hypertension Father   . Heart attack Father   . Valvular heart disease Father   . Colon cancer Neg Hx    .Marland Kitchen Social History   Socioeconomic History  . Marital status: Married    Spouse name: Not on file  . Number of children: Not on file  . Years of education: Not on file  . Highest education level: Not on file  Occupational History  . Not on file  Tobacco Use  . Smoking status: Never Smoker  . Smokeless tobacco: Never Used  Vaping Use  . Vaping Use: Never used  Substance and Sexual Activity  . Alcohol use: Yes    Alcohol/week: 1.0 standard drink    Types: 1 Glasses of wine per week  . Drug use: No  . Sexual activity: Yes  Other Topics Concern  . Not on file  Social History Narrative  . Not on file   Social Determinants of Health   Financial Resource Strain:   . Difficulty of Paying Living Expenses: Not on file  Food Insecurity:   . Worried About Running  Out of Food in the Last Year: Not on file  . Ran Out of Food in the Last Year: Not on file  Transportation Needs:   . Lack of Transportation (Medical): Not on file  . Lack of Transportation (Non-Medical): Not on file  Physical Activity:   . Days of Exercise per Week: Not on file  . Minutes of Exercise per Session: Not on file  Stress:   . Feeling of Stress : Not on file  Social Connections:   . Frequency of Communication with Friends and Family: Not on file  . Frequency of Social Gatherings with Friends and Family: Not on file  . Attends Religious Services: Not on file  . Active Member of Clubs or Organizations: Not on file  . Attends Archivist Meetings: Not  on file  . Marital Status: Not on file  Intimate Partner Violence:   . Fear of Current or Ex-Partner: Not on file  . Emotionally Abused: Not on file  . Physically Abused: Not on file  . Sexually Abused: Not on file     Review of Systems  All other systems reviewed and are negative.      Objective:   Physical Exam BP 119/78   Pulse 71   Ht 5\' 11"  (1.803 m)   Wt 212 lb (96.2 kg)   SpO2 97%   BMI 29.57 kg/m   General Appearance:    Alert, cooperative, no distress, appears stated age  Head:    Normocephalic, without obvious abnormality, atraumatic  Eyes:    PERRL, conjunctiva/corneas clear, EOM's intact, fundi    benign, both eyes       Ears:    Normal TM's and external ear canals, both ears  Nose:   Nares normal, septum midline, mucosa normal, no drainage    or sinus tenderness  Throat:   Lips, mucosa, and tongue normal; teeth and gums normal  Neck:   Supple, symmetrical, trachea midline, no adenopathy;       thyroid:  No enlargement/tenderness/nodules; no carotid   bruit or JVD  Back:     Symmetric, no curvature, ROM normal, no CVA tenderness  Lungs:     Clear to auscultation bilaterally, respirations unlabored  Chest wall:    No tenderness or deformity  Heart:    Regular rate and rhythm, S1 and S2 normal, no murmur, rub   or gallop  Abdomen:     Soft, non-tender, bowel sounds active all four quadrants,    no masses, no organomegaly        Extremities:   Extremities normal, atraumatic, no cyanosis or edema  Pulses:   2+ and symmetric all extremities  Skin:   Skin color, texture, turgor normal, no rashes or lesions. SKs on legs.   Lymph nodes:   Cervical, supraclavicular, and axillary nodes normal  Neurologic:   CNII-XII intact. Normal strength, sensation and reflexes      throughout   .Marland Kitchen Depression screen [facility] 2/9 07/16/2020 03/11/2020 07/07/2017  Decreased Interest 0 0 0  Down, Depressed, Hopeless 0 0 0  PHQ - 2 Score 0 0 0  Altered sleeping 0 0 -  Tired, decreased  energy 0 0 -  Change in appetite 0 0 -  Feeling bad or failure about yourself  0 0 -  Trouble concentrating 0 0 -  Moving slowly or fidgety/restless 0 0 -  Suicidal thoughts - 0 -  PHQ-9 Score 0 0 -  Difficult doing work/chores Not difficult at all Not  difficult at all -   .. GAD 7 : Generalized Anxiety Score 07/16/2020 03/11/2020  Nervous, Anxious, on Edge 0 0  Control/stop worrying 0 0  Worry too much - different things 0 0  Trouble relaxing 0 0  Restless 0 0  Easily annoyed or irritable 0 1  Afraid - awful might happen 0 0  Total GAD 7 Score 0 1  Anxiety Difficulty Not difficult at all Not difficult at all       Assessment & Plan:  Marland KitchenMarland KitchenDiagnoses and all orders for this visit:  Routine physical examination -     Lipid Panel w/reflex Direct LDL -     COMPLETE METABOLIC PANEL WITH GFR -     Hemoglobin A1c -     B12 -     PSA -     Ambulatory referral to Gastroenterology  Pre-diabetes -     COMPLETE METABOLIC PANEL WITH GFR -     Hemoglobin A1c  Elevated LDL cholesterol level -     Lipid Panel w/reflex Direct LDL -     COMPLETE METABOLIC PANEL WITH GFR  Pernicious anemia -     B12  Prostate cancer screening -     PSA  Colon cancer screening -     Ambulatory referral to Gastroenterology   .Marland Kitchen Discussed 150 minutes of exercise a week.  Screening labs ordered.  Not able to get vaccinated due to GBS.  Reorder colonoscopy.  On ASA daily.  Discussed risk vs benefit of statin. Repeat lipid panel today.   B12 monthly-recheck today. Pt would like to learn how to give on shot. Ok at next visit.    Reassurance given about SKs. Follow up as needed or with any skin changes.

## 2020-07-16 NOTE — Patient Instructions (Signed)
Health Maintenance After Age 68 After age 68, you are at a higher risk for certain long-term diseases and infections as well as injuries from falls. Falls are a major cause of broken bones and head injuries in people who are older than age 68. Getting regular preventive care can help to keep you healthy and well. Preventive care includes getting regular testing and making lifestyle changes as recommended by your health care provider. Talk with your health care provider about:  Which screenings and tests you should have. A screening is a test that checks for a disease when you have no symptoms.  A diet and exercise plan that is right for you. What should I know about screenings and tests to prevent falls? Screening and testing are the best ways to find a health problem early. Early diagnosis and treatment give you the best chance of managing medical conditions that are common after age 68. Certain conditions and lifestyle choices may make you more likely to have a fall. Your health care provider may recommend:  Regular vision checks. Poor vision and conditions such as cataracts can make you more likely to have a fall. If you wear glasses, make sure to get your prescription updated if your vision changes.  Medicine review. Work with your health care provider to regularly review all of the medicines you are taking, including over-the-counter medicines. Ask your health care provider about any side effects that may make you more likely to have a fall. Tell your health care provider if any medicines that you take make you feel dizzy or sleepy.  Osteoporosis screening. Osteoporosis is a condition that causes the bones to get weaker. This can make the bones weak and cause them to break more easily.  Blood pressure screening. Blood pressure changes and medicines to control blood pressure can make you feel dizzy.  Strength and balance checks. Your health care provider may recommend certain tests to check your  strength and balance while standing, walking, or changing positions.  Foot health exam. Foot pain and numbness, as well as not wearing proper footwear, can make you more likely to have a fall.  Depression screening. You may be more likely to have a fall if you have a fear of falling, feel emotionally low, or feel unable to do activities that you used to do.  Alcohol use screening. Using too much alcohol can affect your balance and may make you more likely to have a fall. What actions can I take to lower my risk of falls? General instructions  Talk with your health care provider about your risks for falling. Tell your health care provider if: ? You fall. Be sure to tell your health care provider about all falls, even ones that seem minor. ? You feel dizzy, sleepy, or off-balance.  Take over-the-counter and prescription medicines only as told by your health care provider. These include any supplements.  Eat a healthy diet and maintain a healthy weight. A healthy diet includes low-fat dairy products, low-fat (lean) meats, and fiber from whole grains, beans, and lots of fruits and vegetables. Home safety  Remove any tripping hazards, such as rugs, cords, and clutter.  Install safety equipment such as grab bars in bathrooms and safety rails on stairs.  Keep rooms and walkways well-lit. Activity   Follow a regular exercise program to stay fit. This will help you maintain your balance. Ask your health care provider what types of exercise are appropriate for you.  If you need a cane or   walker, use it as recommended by your health care provider.  Wear supportive shoes that have nonskid soles. Lifestyle  Do not drink alcohol if your health care provider tells you not to drink.  If you drink alcohol, limit how much you have: ? 0-1 drink a day for women. ? 0-2 drinks a day for men.  Be aware of how much alcohol is in your drink. In the [place] one drink equals one typical bottle of beer (12  oz), one-half glass of wine (5 oz), or one shot of hard liquor (1 oz).  Do not use any products that contain nicotine or tobacco, such as cigarettes and e-cigarettes. If you need help quitting, ask your health care provider. Summary  Having a healthy lifestyle and getting preventive care can help to protect your health and wellness after age 68.  Screening and testing are the best way to find a health problem early and help you avoid having a fall. Early diagnosis and treatment give you the best chance for managing medical conditions that are more common for people who are older than age 68.  Falls are a major cause of broken bones and head injuries in people who are older than age 68. Take precautions to prevent a fall at home.  Work with your health care provider to learn what changes you can make to improve your health and wellness and to prevent falls. This information is not intended to replace advice given to you by your health care provider. Make sure you discuss any questions you have with your health care provider. Document Revised: 02/02/2019 Document Reviewed: 08/25/2017 Elsevier Patient Education  2020 Elsevier Inc.  

## 2020-08-01 ENCOUNTER — Other Ambulatory Visit: Payer: Self-pay

## 2020-08-01 ENCOUNTER — Ambulatory Visit (INDEPENDENT_AMBULATORY_CARE_PROVIDER_SITE_OTHER): Payer: Medicare HMO | Admitting: Medical-Surgical

## 2020-08-01 VITALS — BP 144/86 | HR 47

## 2020-08-01 DIAGNOSIS — D510 Vitamin B12 deficiency anemia due to intrinsic factor deficiency: Secondary | ICD-10-CM

## 2020-08-01 DIAGNOSIS — Z0000 Encounter for general adult medical examination without abnormal findings: Secondary | ICD-10-CM | POA: Diagnosis not present

## 2020-08-01 DIAGNOSIS — E7800 Pure hypercholesterolemia, unspecified: Secondary | ICD-10-CM | POA: Diagnosis not present

## 2020-08-01 DIAGNOSIS — R7303 Prediabetes: Secondary | ICD-10-CM | POA: Diagnosis not present

## 2020-08-01 DIAGNOSIS — Z125 Encounter for screening for malignant neoplasm of prostate: Secondary | ICD-10-CM | POA: Diagnosis not present

## 2020-08-01 MED ORDER — CYANOCOBALAMIN 1000 MCG/ML IJ SOLN: 1000.0000 ug | Freq: Once | INTRAMUSCULAR | 0 refills | Status: DC

## 2020-08-01 MED ORDER — CYANOCOBALAMIN 1000 MCG/ML IJ SOLN
1000.0000 ug | Freq: Once | INTRAMUSCULAR | Status: AC
  Administered 2020-08-01: 1000 ug via INTRAMUSCULAR

## 2020-08-01 NOTE — Progress Notes (Signed)
Patient is here for a vitamin B12 injection. Denies gastrointestinal problems or dizziness. He originally wanted to learn how to inject himself, but while showing him he changed his mind and will continue being seen here monthly. B12 injection to left deltoid with no apparent complications. Patient advised to schedule next injection in 30 days.

## 2020-08-02 LAB — PSA: PSA: 1.91 ng/mL (ref <=4.0)

## 2020-08-02 LAB — LIPID PANEL W/REFLEX DIRECT LDL
Cholesterol: 181 mg/dL (ref <200)
HDL: 46 mg/dL (ref >=40)
LDL Cholesterol (Calc): 118 mg/dL (calc) — ABNORMAL HIGH
Non-HDL Cholesterol (Calc): 135 mg/dL (calc) — ABNORMAL HIGH (ref <130)
Total CHOL/HDL Ratio: 3.9 (calc) (ref <5.0)
Triglycerides: 77 mg/dL (ref <150)

## 2020-08-02 LAB — COMPLETE METABOLIC PANEL WITH GFR
AG Ratio: 2 (calc) (ref 1.0–2.5)
ALT: 12 U/L (ref 9–46)
AST: 16 U/L (ref 10–35)
Albumin: 4.1 g/dL (ref 3.6–5.1)
Alkaline phosphatase (APISO): 54 U/L (ref 35–144)
BUN: 19 mg/dL (ref 7–25)
CO2: 25 mmol/L (ref 20–32)
Calcium: 9.1 mg/dL (ref 8.6–10.3)
Chloride: 108 mmol/L (ref 98–110)
Creat: 1.06 mg/dL (ref 0.70–1.25)
GFR, Est African American: 83 mL/min/{1.73_m2} (ref >=60)
GFR, Est Non African American: 72 mL/min/{1.73_m2} (ref >=60)
Globulin: 2.1 g/dL (calc) (ref 1.9–3.7)
Glucose, Bld: 101 mg/dL (ref 65–139)
Potassium: 4.3 mmol/L (ref 3.5–5.3)
Sodium: 141 mmol/L (ref 135–146)
Total Bilirubin: 0.6 mg/dL (ref 0.2–1.2)
Total Protein: 6.2 g/dL (ref 6.1–8.1)

## 2020-08-02 LAB — HEMOGLOBIN A1C
Hgb A1c MFr Bld: 5.5 % of total Hgb (ref <5.7)
Mean Plasma Glucose: 111 (calc)
eAG (mmol/L): 6.2 (calc)

## 2020-08-02 LAB — VITAMIN B12: Vitamin B-12: >2000 pg/mL — ABNORMAL HIGH (ref 200–1100)

## 2020-08-02 NOTE — Progress Notes (Signed)
Bill.   A1C went down to 5.5 out of pre diabetes range.  Kidney, liver look great.  PSA great.  B12 is now elevated over 2000. Lets cut back to every 6 weeks b12 injection.  Cholesterol is about the same. No major changes. Mostly due to age and last BP but CV risk is still elevated. Low dose statin could really help to prevent CV event in future. Please consider.

## 2020-08-28 ENCOUNTER — Ambulatory Visit (INDEPENDENT_AMBULATORY_CARE_PROVIDER_SITE_OTHER): Payer: Medicare HMO

## 2020-08-28 ENCOUNTER — Other Ambulatory Visit: Payer: Self-pay

## 2020-08-28 ENCOUNTER — Ambulatory Visit (INDEPENDENT_AMBULATORY_CARE_PROVIDER_SITE_OTHER): Payer: Medicare HMO | Admitting: Sports Medicine

## 2020-08-28 DIAGNOSIS — Z96652 Presence of left artificial knee joint: Secondary | ICD-10-CM | POA: Diagnosis not present

## 2020-08-28 DIAGNOSIS — M25461 Effusion, right knee: Secondary | ICD-10-CM | POA: Diagnosis not present

## 2020-08-28 DIAGNOSIS — M1711 Unilateral primary osteoarthritis, right knee: Secondary | ICD-10-CM | POA: Diagnosis not present

## 2020-08-28 DIAGNOSIS — M25761 Osteophyte, right knee: Secondary | ICD-10-CM | POA: Diagnosis not present

## 2020-08-28 DIAGNOSIS — M170 Bilateral primary osteoarthritis of knee: Secondary | ICD-10-CM | POA: Diagnosis not present

## 2020-08-28 DIAGNOSIS — M25562 Pain in left knee: Secondary | ICD-10-CM | POA: Diagnosis not present

## 2020-08-28 DIAGNOSIS — Z471 Aftercare following joint replacement surgery: Secondary | ICD-10-CM | POA: Diagnosis not present

## 2020-08-28 NOTE — Assessment & Plan Note (Signed)
This is a pleasant 69 year old male, we have treated him extensively for his left knee with steroid injections, viscosupplementation, ultimately he did have an arthroplasty which is doing well. He is having similar pain in his right knee, oral analgesics, NSAIDs not effective, injected today, updated x-rays today. Rehab exercises given, return to see me virtually in a month, he does prefer to go straight to arthroplasty rather than viscosupplementation if failure of steroid injection and I think this is appropriate.

## 2020-08-28 NOTE — Progress Notes (Signed)
    Procedures performed today:    Procedure: Real-time Ultrasound Guided injection of the right knee Device: Samsung HS60  Verbal informed consent obtained.  Time-out conducted.  Noted no overlying erythema, induration, or other signs of local infection.  Skin prepped in a sterile fashion.  Local anesthesia: Topical Ethyl chloride.  With sterile technique and under real time ultrasound guidance:  Noted mild effusion, 1 cc Kenalog 40, 2 cc lidocaine, 2 cc bupivacaine injected easily Completed without difficulty  Advised to call if fevers/chills, erythema, induration, drainage, or persistent bleeding.  Images permanently stored and available for review in PACS.  Impression: Technically successful ultrasound guided injection.  Independent interpretation of notes and tests performed by another provider:   None.  Brief History, Exam, Impression, and Recommendations:    Primary osteoarthritis of both knees This is a pleasant 69 year old male, we have treated him extensively for his left knee with steroid injections, viscosupplementation, ultimately he did have an arthroplasty which is doing well. He is having similar pain in his right knee, oral analgesics, NSAIDs not effective, injected today, updated x-rays today. Rehab exercises given, return to see me virtually in a month, he does prefer to go straight to arthroplasty rather than viscosupplementation if failure of steroid injection and I think this is appropriate.    ___________________________________________ Gwen Her. Dianah Field, M.D., ABFM., CAQSM. Primary Care and [place] Instructor of [place] of [facility] of Medicine

## 2020-08-29 ENCOUNTER — Ambulatory Visit (INDEPENDENT_AMBULATORY_CARE_PROVIDER_SITE_OTHER): Payer: Medicare HMO | Admitting: Family Medicine

## 2020-08-29 VITALS — BP 113/78 | HR 70

## 2020-08-29 DIAGNOSIS — E538 Deficiency of other specified B group vitamins: Secondary | ICD-10-CM

## 2020-08-29 MED ORDER — CYANOCOBALAMIN 1000 MCG/ML IJ SOLN
1000.0000 ug | Freq: Once | INTRAMUSCULAR | Status: AC
  Administered 2020-08-29: 1000 ug via INTRAMUSCULAR

## 2020-08-29 NOTE — Progress Notes (Signed)
   Subjective:    Patient ID: Jeffery Anthony, male    DOB: 08-25-1951, 69 y.o.   MRN: [id number]  HPI Patient is here for a Vitamin B12 injection. Denied any gastrointestional problems or dizziness.   Review of Systems     Objective:   Physical Exam        Assessment & Plan:  Patient tolerated injection well without complication. Patient advised to schedule next injection in 30 days

## 2020-08-29 NOTE — Progress Notes (Signed)
Agree with documentation as above.   Chaeli Judy, MD  

## 2020-09-03 MED ORDER — HYDROCODONE-ACETAMINOPHEN 5-325 MG PO TABS: 1.0000 | ORAL_TABLET | Freq: Three times a day (TID) | ORAL | 0 refills | Status: DC | PRN

## 2020-09-25 ENCOUNTER — Telehealth (INDEPENDENT_AMBULATORY_CARE_PROVIDER_SITE_OTHER): Payer: Medicare HMO | Admitting: Sports Medicine

## 2020-09-25 DIAGNOSIS — M170 Bilateral primary osteoarthritis of knee: Secondary | ICD-10-CM

## 2020-09-25 NOTE — Assessment & Plan Note (Signed)
Jeffery Anthony is a very pleasant 69 year old male, he does have a history of a left knee arthroplasty, doing well, done by Dr. Berenice Primas. He has similar problems in his right knee, we did an injection at the last visit, he had a few days of relief with recurrence of discomfort.  At this point he is not interested in viscosupplementation and is more interested in going straight to arthroplasty, I would like to do a referral back to Dr. Berenice Primas for this. He is looking at sometime early next year to have the procedure done.

## 2020-09-25 NOTE — Progress Notes (Signed)
   Virtual Visit via WebEx/MyChart   I connected with  Jeffery Anthony  on 09/25/20 via WebEx/MyChart/Doximity Video and verified that I am speaking with the correct person using two identifiers.   I discussed the limitations, risks, security and privacy concerns of performing an evaluation and management service by WebEx/MyChart/Doximity Video, including the higher likelihood of inaccurate diagnosis and treatment, and the availability of in person appointments.  We also discussed the likely need of an additional face to face encounter for complete and high quality delivery of care.  I also discussed with the patient that there may be a patient responsible charge related to this service. The patient expressed understanding and wishes to proceed.  Provider location is in medical facility. Patient location is at their home, different from provider location. People involved in care of the patient during this telehealth encounter were myself, my nurse/medical assistant, and my front office/scheduling team member.  Review of Systems: No fevers, chills, night sweats, weight loss, chest pain, or shortness of breath.   Objective Findings:    General: Speaking full sentences, no audible heavy breathing.  Sounds alert and appropriately interactive.  Appears well.  Face symmetric.  Extraocular movements intact.  Pupils equal and round.  No nasal flaring or accessory muscle use visualized.  Independent interpretation of tests performed by another provider:   None.  Brief History, Exam, Impression, and Recommendations:    Primary osteoarthritis of both knees Jeffery Anthony is a very pleasant 69 year old male, he does have a history of a left knee arthroplasty, doing well, done by Dr. Berenice Primas. He has similar problems in his right knee, we did an injection at the last visit, he had a few days of relief with recurrence of discomfort.  At this point he is not interested in viscosupplementation and is more interested in  going straight to arthroplasty, I would like to do a referral back to Dr. Berenice Primas for this. He is looking at sometime early next year to have the procedure done.   I discussed the above assessment and treatment plan with the patient. The patient was provided an opportunity to ask questions and all were answered. The patient agreed with the plan and demonstrated an understanding of the instructions.   The patient was advised to call back or seek an in-person evaluation if the symptoms worsen or if the condition fails to improve as anticipated.   I provided 15 minutes of face to face and non-face-to-face time during this encounter date, time was needed to gather information, review chart, records, communicate/coordinate with staff remotely, as well as complete documentation.   ___________________________________________ Gwen Her. Dianah Field, M.D., ABFM., CAQSM. Primary Care and [place] Instructor of [place] of [facility] of Medicine

## 2020-09-26 ENCOUNTER — Ambulatory Visit: Payer: Medicare HMO

## 2020-10-02 DIAGNOSIS — R69 Illness, unspecified: Secondary | ICD-10-CM | POA: Diagnosis not present

## 2020-10-03 DIAGNOSIS — M1711 Unilateral primary osteoarthritis, right knee: Secondary | ICD-10-CM | POA: Diagnosis not present

## 2020-10-03 DIAGNOSIS — M25561 Pain in right knee: Secondary | ICD-10-CM | POA: Diagnosis not present

## 2020-10-10 ENCOUNTER — Ambulatory Visit: Payer: Medicare HMO

## 2020-10-16 ENCOUNTER — Encounter: Payer: Self-pay | Admitting: Physician Assistant

## 2020-10-16 DIAGNOSIS — Z20822 Contact with and (suspected) exposure to covid-19: Secondary | ICD-10-CM | POA: Diagnosis not present

## 2020-10-17 MED ORDER — SILDENAFIL CITRATE 100 MG PO TABS: 100.0000 mg | ORAL_TABLET | ORAL | 5 refills | Status: DC | PRN

## 2020-10-17 MED ORDER — ATORVASTATIN CALCIUM 40 MG PO TABS: 40.0000 mg | ORAL_TABLET | Freq: Every day | ORAL | 3 refills | Status: DC

## 2020-10-29 ENCOUNTER — Ambulatory Visit (INDEPENDENT_AMBULATORY_CARE_PROVIDER_SITE_OTHER): Payer: Medicare HMO | Admitting: Physician Assistant

## 2020-10-29 ENCOUNTER — Other Ambulatory Visit: Payer: Self-pay

## 2020-10-29 VITALS — BP 160/77 | HR 74 | Wt 222.7 lb

## 2020-10-29 DIAGNOSIS — E538 Deficiency of other specified B group vitamins: Secondary | ICD-10-CM | POA: Diagnosis not present

## 2020-10-29 MED ORDER — CYANOCOBALAMIN 1000 MCG/ML IJ SOLN
1000.0000 ug | Freq: Once | INTRAMUSCULAR | Status: AC
  Administered 2020-10-29: 1000 ug via INTRAMUSCULAR

## 2020-10-29 NOTE — Progress Notes (Signed)
Patient is here for a vitamin B12 injection. Denies GI problems or dizziness. Location: LD  Patient tolerated injection well without complications. Patient advised to schedule next injection in 30 days.

## 2020-10-29 NOTE — Progress Notes (Signed)
Patient ID: Jeffery Anthony, male   DOB: Oct 18, 1951, 70 y.o.   MRN: [id number] Agree with plan.

## 2020-11-26 ENCOUNTER — Other Ambulatory Visit: Payer: Self-pay

## 2020-11-26 ENCOUNTER — Ambulatory Visit (INDEPENDENT_AMBULATORY_CARE_PROVIDER_SITE_OTHER): Payer: Medicare HMO | Admitting: Physician Assistant

## 2020-11-26 ENCOUNTER — Ambulatory Visit (INDEPENDENT_AMBULATORY_CARE_PROVIDER_SITE_OTHER): Payer: Medicare HMO

## 2020-11-26 ENCOUNTER — Encounter: Payer: Self-pay | Admitting: Physician Assistant

## 2020-11-26 VITALS — BP 147/107 | HR 55 | Ht 71.0 in | Wt 224.0 lb

## 2020-11-26 DIAGNOSIS — R0781 Pleurodynia: Secondary | ICD-10-CM

## 2020-11-26 DIAGNOSIS — S2232XA Fracture of one rib, left side, initial encounter for closed fracture: Secondary | ICD-10-CM

## 2020-11-26 DIAGNOSIS — D510 Vitamin B12 deficiency anemia due to intrinsic factor deficiency: Secondary | ICD-10-CM | POA: Diagnosis not present

## 2020-11-26 DIAGNOSIS — M25512 Pain in left shoulder: Secondary | ICD-10-CM | POA: Insufficient documentation

## 2020-11-26 DIAGNOSIS — W19XXXA Unspecified fall, initial encounter: Secondary | ICD-10-CM | POA: Diagnosis not present

## 2020-11-26 DIAGNOSIS — M75122 Complete rotator cuff tear or rupture of left shoulder, not specified as traumatic: Secondary | ICD-10-CM | POA: Insufficient documentation

## 2020-11-26 DIAGNOSIS — G8929 Other chronic pain: Secondary | ICD-10-CM | POA: Insufficient documentation

## 2020-11-26 DIAGNOSIS — M19012 Primary osteoarthritis, left shoulder: Secondary | ICD-10-CM

## 2020-11-26 DIAGNOSIS — E538 Deficiency of other specified B group vitamins: Secondary | ICD-10-CM | POA: Diagnosis not present

## 2020-11-26 DIAGNOSIS — R21 Rash and other nonspecific skin eruption: Secondary | ICD-10-CM | POA: Diagnosis not present

## 2020-11-26 DIAGNOSIS — W000XXA Fall on same level due to ice and snow, initial encounter: Secondary | ICD-10-CM

## 2020-11-26 DIAGNOSIS — M7542 Impingement syndrome of left shoulder: Secondary | ICD-10-CM | POA: Insufficient documentation

## 2020-11-26 MED ORDER — CYANOCOBALAMIN 1000 MCG/ML IJ SOLN
1000.0000 ug | Freq: Once | INTRAMUSCULAR | Status: AC
  Administered 2020-11-26: 1000 ug via INTRAMUSCULAR

## 2020-11-26 MED ORDER — SILDENAFIL CITRATE 100 MG PO TABS: 100.0000 mg | ORAL_TABLET | ORAL | 2 refills | Status: DC | PRN

## 2020-11-26 MED ORDER — MELOXICAM 15 MG PO TABS: 15.0000 mg | ORAL_TABLET | Freq: Every day | ORAL | 1 refills | Status: DC

## 2020-11-26 MED ORDER — NYSTATIN-TRIAMCINOLONE 100000-0.1 UNIT/GM-% EX CREA: 1.0000 "application " | TOPICAL_CREAM | Freq: Two times a day (BID) | CUTANEOUS | 0 refills | Status: DC

## 2020-11-26 NOTE — Progress Notes (Signed)
8th rib fracture but its non-displaced but could certainly have some pain off and on for the next few weeks.

## 2020-11-26 NOTE — Progress Notes (Addendum)
Acute Office Visit  Subjective:    Patient ID: Jeffery Anthony, male    DOB: [birth year], 70 y.o.   MRN: [id number]  Chief Complaint  Patient presents with  . Rash  . Shoulder Pain    70 year old male presents with 2 week history of left shoulder pain and rib pain secondary to a fall onto his left shoulder and side due to a slip on ice.  He states that he was holding his dog and rotated so as to not fall on the dog, which led to the left side being impacted.  He states that immediately after the fall he looked over himself and felt as though the damage was more muscular in nature and that his bones felt intact.  He treated conservatively with ice on his ribs and shoulder, and wrapping his ribs.  After the first 3 days, he has used icey hot on the area, with stretches to his shoulder in the shower.  He now is taking tylenol arthritis and Aleve for pain.  His shoulder is currently tender to palpation along the proximal bicep, and painful with any motion that engages muscles in the area, notably lifting and carrying objects.  His strength and range of motion are limited mainly by pain.  He denies ShOB, tingling, numbness, deformity in arm, shoulder, or back.    He also reports a rash that has been coming and going bilaterally in his axilla over the past month.  It will be red and itchy for a few days, and then go down and be greatly reduced for a few days.  He has not been able to identify a factor such as deodorant, clothing, detergent that may be causing this rash.  He doesn't have a rash anywhere else on his body.  Currently his rash is minimal.    Rash This is a new problem. The current episode started 1 to 4 weeks ago. The problem has been waxing and waning since onset. The affected locations include the left axilla, right axilla and left hip. The rash is characterized by itchiness and redness. It is unknown if there was an exposure to a precipitant. Pertinent negatives include no anorexia,  congestion, cough, diarrhea, eye pain, facial edema, fatigue, fever, joint pain, nail changes, rhinorrhea, shortness of breath, sore throat or vomiting. Past treatments include nothing.  Shoulder Pain  The pain is present in the left shoulder. This is a new problem. The current episode started 1 to 4 weeks ago. There has been a history of trauma. The problem occurs constantly. The problem has been unchanged. The pain is moderate. Associated symptoms include a limited range of motion. Pertinent negatives include no fever, joint locking, numbness or tingling. The symptoms are aggravated by activity. He has tried cold, heat, acetaminophen and rest for the symptoms. The treatment provided mild relief. His past medical history is significant for osteoarthritis.    Past Medical History:  Diagnosis Date  . Arthritis    knees  . GERD (gastroesophageal reflux disease)   . Headache    migraines    Past Surgical History:  Procedure Laterality Date  . KNEE ARTHROSCOPY Bilateral 1992, 2008  . ruptured achilles tendon Left 1992  . TONSILLECTOMY AND ADENOIDECTOMY  1961  . 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 Hyperlipidemia Father   .  Hypertension Father   . Heart attack Father   . Valvular heart disease Father   . Colon cancer Neg Hx     Social History   Socioeconomic History  . Marital status: Married    Spouse name: Not on file  . Number of children: Not on file  . Years of education: Not on file  . Highest education level: Not on file  Occupational History  . Not on file  Tobacco Use  . Smoking status: Never Smoker  . Smokeless tobacco: Never Used  Vaping Use  . Vaping Use: Never used  Substance and Sexual Activity  . Alcohol use: Yes    Alcohol/week: 1.0 standard drink    Types: 1 Glasses of wine  per week  . Drug use: No  . Sexual activity: Yes  Other Topics Concern  . Not on file  Social History Narrative  . Not on file   Social Determinants of Health   Financial Resource Strain: Not on file  Food Insecurity: Not on file  Transportation Needs: Not on file  Physical Activity: Not on file  Stress: Not on file  Social Connections: Not on file  Intimate Partner Violence: Not on file    Outpatient Medications Prior to Visit  Medication Sig Dispense Refill  . aspirin EC 81 MG tablet Take 81 mg by mouth daily.    Marland Kitchen aspirin-acetaminophen-caffeine (EXCEDRIN MIGRAINE) 250-250-65 MG tablet Take 1 tablet by mouth every 6 (six) hours as needed for migraine.    Marland Kitchen atorvastatin (LIPITOR) 40 MG tablet Take 1 tablet (40 mg total) by mouth daily. 90 tablet 3  . sildenafil (VIAGRA) 100 MG tablet Take 1 tablet (100 mg total) by mouth as needed for erectile dysfunction (for use prior to sexual activity). 10 tablet 5  . vitamin B-12 (CYANOCOBALAMIN) 1000 MCG tablet Take 1,000 mcg by mouth daily.    Marland Kitchen HYDROcodone-acetaminophen (NORCO/VICODIN) 5-325 MG tablet Take 1 tablet by mouth every 8 (eight) hours as needed for moderate pain. 15 tablet 0   No facility-administered medications prior to visit.    Allergies  Allergen Reactions  . Iodinated Diagnostic Agents Nausea And Vomiting and Other (See Comments)    iodine dye    Review of Systems  Constitutional: Negative for fatigue and fever.  HENT: Negative for congestion, rhinorrhea and sore throat.   Eyes: Negative for pain.  Respiratory: Negative for cough, chest tightness, shortness of breath and wheezing.   Cardiovascular: Negative for chest pain.  Gastrointestinal: Negative for abdominal distention, abdominal pain, anorexia, diarrhea and vomiting.  Genitourinary: Negative for frequency and urgency.  Musculoskeletal: Positive for myalgias. Negative for joint pain, joint swelling, neck pain and neck stiffness.  Skin: Positive for color  change and rash. Negative for nail changes.  Neurological: Negative for tingling and numbness.  Psychiatric/Behavioral: Negative.        Objective:    Physical Exam Constitutional:      General: He is not in acute distress.    Appearance: Normal appearance.  HENT:     Head: Normocephalic.  Eyes:     Extraocular Movements: Extraocular movements intact.     Conjunctiva/sclera: Conjunctivae normal.     Pupils: Pupils are equal, round, and reactive to light.  Cardiovascular:     Rate and Rhythm: Normal rate and regular rhythm.     Pulses: Normal pulses.     Heart sounds: Normal heart sounds.  Pulmonary:     Effort: Pulmonary effort is normal.     Breath  sounds: Normal breath sounds.  Chest:    Abdominal:     General: Abdomen is flat.  Musculoskeletal:        General: Tenderness and signs of injury present. No swelling or deformity.     Right shoulder: Normal.     Left shoulder: Tenderness present. No swelling, deformity, effusion, bony tenderness or crepitus. Decreased range of motion. Decreased strength.     Right upper arm: Normal.     Left upper arm: Tenderness present. No swelling, edema or deformity.     Cervical back: Normal range of motion. No rigidity or tenderness.     Comments: Positive impingement signs.  Tenderness in lateral shoulder(AC joint) and near bicipital groove, especially with resisted flexion of biceps. Positive hawkins sign. Negative Yergason. Pain with ROM abduction past 90 degrees. Negative for drop arm sign.   Skin:    General: Skin is warm and dry.     Capillary Refill: Capillary refill takes less than 2 seconds.     Findings: Rash present. Rash is scaling.       Neurological:     General: No focal deficit present.     Mental Status: He is alert and oriented to person, place, and time. Mental status is at baseline.  Psychiatric:        Mood and Affect: Mood normal.        Behavior: Behavior normal.     BP (!) 147/107   Pulse (!) 55   Ht  5\' 11"  (1.803 m)   Wt 224 lb (101.6 kg)   SpO2 100%   BMI 31.24 kg/m  Wt Readings from Last 3 Encounters:  11/26/20 224 lb (101.6 kg)  10/29/20 222 lb 11.2 oz (101 kg)  07/16/20 212 lb (96.2 kg)    There are no preventive care reminders to display for this patient.  There are no preventive care reminders to display for this patient.   Lab Results  Component Value Date   TSH 2.54 12/11/2019   Lab Results  Component Value Date   WBC 4.8 12/11/2019   HGB 14.1 12/11/2019   HCT 39.7 12/11/2019   MCV 116.8 (H) 12/11/2019   PLT 224 12/11/2019   Lab Results  Component Value Date   NA 141 08/01/2020   K 4.3 08/01/2020   CO2 25 08/01/2020   GLUCOSE 101 08/01/2020   BUN 19 08/01/2020   CREATININE 1.06 08/01/2020   BILITOT 0.6 08/01/2020   ALKPHOS 44 09/09/2018   AST 16 08/01/2020   ALT 12 08/01/2020   PROT 6.2 08/01/2020   ALBUMIN 4.4 09/09/2018   CALCIUM 9.1 08/01/2020   ANIONGAP 7 09/09/2018   Lab Results  Component Value Date   CHOL 181 08/01/2020   Lab Results  Component Value Date   HDL 46 08/01/2020   Lab Results  Component Value Date   LDLCALC 118 (H) 08/01/2020   Lab Results  Component Value Date   TRIG 77 08/01/2020   Lab Results  Component Value Date   CHOLHDL 3.9 08/01/2020   Lab Results  Component Value Date   HGBA1C 5.5 08/01/2020       Assessment & Plan:  Marland KitchenMarland KitchenBolton was seen today for rash and shoulder pain.  Diagnoses and all orders for this visit:  Fall, initial encounter  Pernicious anemia -     cyanocobalamin ((VITAMIN B-12)) injection 1,000 mcg  B12 deficiency -     cyanocobalamin ((VITAMIN B-12)) injection 1,000 mcg  Acute pain of left shoulder -  DG Shoulder Left -     meloxicam (MOBIC) 15 MG tablet; Take 1 tablet (15 mg total) by mouth daily.  Rib pain on left side -     DG Ribs Unilateral Left -     meloxicam (MOBIC) 15 MG tablet; Take 1 tablet (15 mg total) by mouth daily.  Rash and nonspecific skin eruption -      nystatin-triamcinolone (MYCOLOG II) cream; Apply 1 application topically 2 (two) times daily.  Arthritis of left acromioclavicular joint  Closed fracture of one rib of left side, initial encounter    1.  Cortical irregularity of left anterolateral 8th rib, suspicious for nondisplaced fx:  Take meloxicam as specified below for inflammation and pain. Reassured patient about pain expectation. Ice as needed. Continue to take good deep breaths.   2.  Impingement, L shoulder with arthritis:  Provided patient education on care for shoulder including stretches and exercises.  Take meloxicam as specified below for inflammation and pain.  If no improvement or worsening of condition in next 10 days, patient to follow up with sports medicine.   3.  B12 deficiency:  Injection performed 1065mcg cyanocobalbumin.    4. Rash unclear and improving but appears like yeast/fungus due to moisture. Nystatin/trimaincinlone given to use as needed. Try to keep area dry.   Marland KitchenVernetta Honey PA-C, have reviewed and agree with the above documentation in it's entirety.

## 2020-11-26 NOTE — Progress Notes (Signed)
No acute changes but you do have a lot of arthritis in the [facility] joint which is probably why this fall hurt so bad. Start exercises, mobic and if not improving Dr. Darene Lamer to consider injection.

## 2020-11-26 NOTE — Addendum Note (Signed)
Addended by: Narda Rutherford on: 11/26/2020 02:45 PM   Modules accepted: Orders

## 2020-11-26 NOTE — Patient Instructions (Signed)
Shoulder Impingement Syndrome Rehab Ask your health care provider which exercises are safe for you. Do exercises exactly as told by your health care provider and adjust them as directed. It is normal to feel mild stretching, pulling, tightness, or discomfort as you do these exercises. Stop right away if you feel sudden pain or your pain gets worse. Do not begin these exercises until told by your health care provider. Stretching and range-of-motion exercise This exercise warms up your muscles and joints and improves the movement and flexibility of your shoulder. This exercise also helps to relieve pain and stiffness. Passive horizontal adduction In passive adduction, you use your other hand to move the injured arm toward your body. The injured arm does not move on its own. In this movement, your arm is moved across your body in the horizontal plane (horizontal adduction). 1. Sit or stand and pull your left / right elbow across your chest, toward your other shoulder. Stop when you feel a gentle stretch in the back of your shoulder and upper arm. ? Keep your arm at shoulder height. ? Keep your arm as close to your body as you comfortably can. 2. Hold for __________ seconds. 3. Slowly return to the starting position. Repeat __________ times. Complete this exercise __________ times a day.   Strengthening exercises These exercises build strength and endurance in your shoulder. Endurance is the ability to use your muscles for a long time, even after they get tired. External rotation, isometric This is an exercise in which you press the back of your wrist against a door frame without moving your shoulder joint (isometric). 1. Stand or sit in a doorway, facing the door frame. 2. Bend your left / right elbow and place the back of your wrist against the door frame. Only the back of your wrist should be touching the frame. Keep your upper arm at your side. 3. Gently press your wrist against the door frame, as  if you are trying to push your arm away from your abdomen (external rotation). Press as hard as you are able without pain. ? Avoid shrugging your shoulder while you press your wrist against the door frame. Keep your shoulder blade tucked down toward the middle of your back. 4. Hold for __________ seconds. 5. Slowly release the tension, and relax your muscles completely before you repeat the exercise. Repeat __________ times. Complete this exercise __________ times a day. Internal rotation, isometric This is an exercise in which you press your palm against a door frame without moving your shoulder joint (isometric). 1. Stand or sit in a doorway, facing the door frame. 2. Bend your left / right elbow and place the palm of your hand against the door frame. Only your palm should be touching the frame. Keep your upper arm at your side. 3. Gently press your hand against the door frame, as if you are trying to push your arm toward your abdomen (internal rotation). Press as hard as you are able without pain. ? Avoid shrugging your shoulder while you press your hand against the door frame. Keep your shoulder blade tucked down toward the middle of your back. 4. Hold for __________ seconds. 5. Slowly release the tension, and relax your muscles completely before you repeat the exercise. Repeat __________ times. Complete this exercise __________ times a day.   Scapular protraction, supine 1. Lie on your back on a firm surface (supine position). Hold a __________ weight in your left / right hand. 2. Raise your left /  right arm straight into the air so your hand is directly above your shoulder joint. 3. Push the weight into the air so your shoulder (scapula) lifts off the surface that you are lying on. The scapula will push up or forward (protraction). Do not move your head, neck, or back. 4. Hold for __________ seconds. 5. Slowly return to the starting position. Let your muscles relax completely before you  repeat this exercise. Repeat __________ times. Complete this exercise __________ times a day.   Scapular retraction 1. Sit in a stable chair without armrests, or stand up. 2. Secure an exercise band to a stable object in front of you so the band is at shoulder height. 3. Hold one end of the exercise band in each hand. Your palms should face down. 4. Squeeze your shoulder blades together (retraction) and move your elbows slightly behind you. Do not shrug your shoulders upward while you do this. 5. Hold for __________ seconds. 6. Slowly return to the starting position. Repeat __________ times. Complete this exercise __________ times a day.   Shoulder extension 1. Sit in a stable chair without armrests, or stand up. 2. Secure an exercise band to a stable object in front of you so the band is above shoulder height. 3. Hold one end of the exercise band in each hand. 4. Straighten your elbows and lift your hands up to shoulder height. 5. Squeeze your shoulder blades together and pull your hands down to the sides of your thighs (extension). Stop when your hands are straight down by your sides. Do not let your hands go behind your body. 6. Hold for __________ seconds. 7. Slowly return to the starting position. Repeat __________ times. Complete this exercise __________ times a day.   This information is not intended to replace advice given to you by your health care provider. Make sure you discuss any questions you have with your health care provider. Document Revised: 02/03/2019 Document Reviewed: 11/07/2018 Elsevier Patient Education  2021 [employer].

## 2020-11-28 ENCOUNTER — Ambulatory Visit: Payer: Medicare HMO

## 2020-11-29 ENCOUNTER — Other Ambulatory Visit: Payer: Self-pay

## 2020-11-29 ENCOUNTER — Ambulatory Visit (AMBULATORY_SURGERY_CENTER): Payer: Self-pay | Admitting: *Deleted

## 2020-11-29 VITALS — Ht 71.0 in | Wt 228.0 lb

## 2020-11-29 DIAGNOSIS — Z86010 Personal history of colonic polyps: Secondary | ICD-10-CM

## 2020-11-29 DIAGNOSIS — Z01818 Encounter for other preprocedural examination: Secondary | ICD-10-CM

## 2020-11-29 MED ORDER — SUTAB 1479-225-188 MG PO TABS: 24.0000 | ORAL_TABLET | ORAL | 0 refills | Status: DC

## 2020-11-29 NOTE — Progress Notes (Signed)
No egg or soy allergy known to patient  No issues with past sedation with any surgeries or procedures No intubation problems in the past  No FH of Malignant Hyperthermia No diet pills per patient No home 02 use per patient  No blood thinners per patient  Pt denies issues with constipation  No A fib or A flutter  EMMI video to pt or via [place] 19 guidelines implemented in PV today with Pt and RN  Pt is not vaccinated  for Covid -2-15 Tuesday at 8 am covid test   [employer] given to pt in PV today , Code to Pharmacy and  NO PA's for preps discussed with pt in PV today   Due to the COVID-19 pandemic we are asking patients to follow certain guidelines.  Pt aware of COVID protocols and [facility] guidelines

## 2020-12-09 DIAGNOSIS — M1711 Unilateral primary osteoarthritis, right knee: Secondary | ICD-10-CM | POA: Diagnosis not present

## 2020-12-10 ENCOUNTER — Encounter: Payer: Self-pay | Admitting: Internal Medicine

## 2020-12-10 ENCOUNTER — Other Ambulatory Visit: Payer: Self-pay | Admitting: Internal Medicine

## 2020-12-10 DIAGNOSIS — Z1159 Encounter for screening for other viral diseases: Secondary | ICD-10-CM | POA: Diagnosis not present

## 2020-12-10 LAB — SARS CORONAVIRUS 2 (TAT 6-24 HRS): SARS Coronavirus 2: NEGATIVE

## 2020-12-13 ENCOUNTER — Encounter: Payer: Self-pay | Admitting: Internal Medicine

## 2020-12-13 ENCOUNTER — Other Ambulatory Visit: Payer: Self-pay

## 2020-12-13 ENCOUNTER — Ambulatory Visit (AMBULATORY_SURGERY_CENTER): Payer: Medicare HMO | Admitting: Internal Medicine

## 2020-12-13 VITALS — BP 126/79 | HR 56 | Temp 97.8°F | Resp 17 | Ht 71.0 in | Wt 228.0 lb

## 2020-12-13 DIAGNOSIS — Z86010 Personal history of colonic polyps: Secondary | ICD-10-CM | POA: Diagnosis not present

## 2020-12-13 MED ORDER — SODIUM CHLORIDE 0.9 % IV SOLN: 500.0000 mL | Freq: Once | INTRAVENOUS | Status: DC

## 2020-12-13 NOTE — Op Note (Signed)
[facility] Patient Name: Jeffery Anthony Procedure Date: 12/13/2020 7:33 AM MRN: [id number] Endoscopist: Docia Chuck. Henrene Pastor , MD Age: 70 Referring MD:  Date of Birth: 16-Jul-1951 Gender: Male Account #: [id number] Procedure:                Colonoscopy Indications:              High risk colon cancer surveillance: Personal                            history of non-advanced adenoma. Index exam                            December 2014 Medicines:                Monitored Anesthesia Care Procedure:                Pre-Anesthesia Assessment:                           - Prior to the procedure, a History and Physical                            was performed, and patient medications and                            allergies were reviewed. The patient's tolerance of                            previous anesthesia was also reviewed. The risks                            and benefits of the procedure and the sedation                            options and risks were discussed with the patient.                            All questions were answered, and informed consent                            was obtained. Prior Anticoagulants: The patient has                            taken no previous anticoagulant or antiplatelet                            agents. After reviewing the risks and benefits, the                            patient was deemed in satisfactory condition to                            undergo the procedure.  After obtaining informed consent, the colonoscope                            was passed under direct vision. Throughout the                            procedure, the patient's blood pressure, pulse, and                            oxygen saturations were monitored continuously. The                            Olympus [id number]) [id number] was introduced                            through the anus and advanced to the the cecum,                             identified by appendiceal orifice and ileocecal                            valve. The ileocecal valve, appendiceal orifice,                            and rectum were photographed. The quality of the                            bowel preparation was poor. The colonoscopy was                            performed without difficulty. The patient tolerated                            the procedure well. The bowel preparation used was                            SUPREP via split dose instruction. Scope In: 8:35:16 AM Scope Out: 8:49:53 AM Scope Withdrawal Time: 0 hours 11 minutes 59 seconds  Total Procedure Duration: 0 hours 14 minutes 37 seconds  Findings:                 The portions of the examined colon appeared normal                            on direct and retroflexion views. However, severe                            limitations due to poor preparation Complications:            No immediate complications. Estimated blood loss:                            None. Estimated Blood Loss:     Estimated blood loss: none. Impression:               -  Preparation of the colon was poor.                           - The entire limited visualized colon is normal on                            direct and retroflexion views.                           - No specimens collected. Recommendation:           - Repeat colonoscopy in 1 year for surveillance.                            Recommend more extensive prep. Avoid seeds and                            roughage 1 week prior to procedure. Would give one                            bottle of magnesium citrate in the morning prior to                            the procedure. Would follow this with standard                            split prep but NOT Sutab                           - Patient has a contact number available for                            emergencies. The signs and symptoms of potential                            delayed complications were  discussed with the                            patient. Return to normal activities tomorrow.                            Written discharge instructions were provided to the                            patient.                           - Resume previous diet.                           - Continue present medications. Docia Chuck. Henrene Pastor, MD 12/13/2020 9:07:19 AM This report has been signed electronically.

## 2020-12-13 NOTE — Patient Instructions (Signed)
YOU HAD AN ENDOSCOPIC PROCEDURE TODAY AT THE [facility]:   Refer to the procedure report that was given to you for any specific questions about what was found during the examination.  If the procedure report does not answer your questions, please call your gastroenterologist to clarify.  If you requested that your care partner not be given the details of your procedure findings, then the procedure report has been included in a sealed envelope for you to review at your convenience later.  YOU SHOULD EXPECT: Some feelings of bloating in the abdomen. Passage of more gas than usual.  Walking can help get rid of the air that was put into your GI tract during the procedure and reduce the bloating. If you had a lower endoscopy (such as a colonoscopy or flexible sigmoidoscopy) you may notice spotting of blood in your stool or on the toilet paper. If you underwent a bowel prep for your procedure, you may not have a normal bowel movement for a few days.  Please Note:  You might notice some irritation and congestion in your nose or some drainage.  This is from the oxygen used during your procedure.  There is no need for concern and it should clear up in a day or so.  SYMPTOMS TO REPORT IMMEDIATELY:   Following lower endoscopy (colonoscopy or flexible sigmoidoscopy):  Excessive amounts of blood in the stool  Significant tenderness or worsening of abdominal pains  Swelling of the abdomen that is new, acute  Fever of 100F or higher  For urgent or emergent issues, a gastroenterologist can be reached at any hour by calling [phone]. Do not use MyChart messaging for urgent concerns.    DIET:  We do recommend a small meal at first, but then you may proceed to your regular diet.  Drink plenty of fluids but you should avoid alcoholic beverages for 24 hours.  ACTIVITY:  You should plan to take it easy for the rest of today and you should NOT DRIVE or use heavy machinery until tomorrow (because  of the sedation medicines used during the test).    FOLLOW UP: Our staff will call the number listed on your records 48-72 hours following your procedure to check on you and address any questions or concerns that you may have regarding the information given to you following your procedure. If we do not reach you, we will leave a message.  We will attempt to reach you two times.  During this call, we will ask if you have developed any symptoms of COVID 19. If you develop any symptoms (ie: fever, flu-like symptoms, shortness of breath, cough etc.) before then, please call [phone].  If you test positive for Covid 19 in the 2 weeks post procedure, please call and report this information to us.    If any biopsies were taken you will be contacted by phone or by letter within the next 1-3 weeks.  Please call us at [phone] if you have not heard about the biopsies in 3 weeks.    SIGNATURES/CONFIDENTIALITY: You and/or your care partner have signed paperwork which will be entered into your electronic medical record.  These signatures attest to the fact that that the information above on your After Visit Summary has been reviewed and is understood.  Full responsibility of the confidentiality of this discharge information lies with you and/or your care-partner. 

## 2020-12-13 NOTE — Progress Notes (Signed)
Pt's states no medical or surgical changes since previsit or office visit.  SB - vitals

## 2020-12-13 NOTE — Progress Notes (Signed)
PT taken to PACU. Monitors in place. VSS. Report given to RN. 

## 2020-12-16 DIAGNOSIS — M1711 Unilateral primary osteoarthritis, right knee: Secondary | ICD-10-CM | POA: Diagnosis not present

## 2020-12-17 ENCOUNTER — Telehealth: Payer: Self-pay

## 2020-12-17 NOTE — Telephone Encounter (Signed)
  Follow up Call-  Call back number 12/13/2020  Post procedure Call Back phone  # [phone]  Permission to leave phone message Yes  Some recent data might be hidden     Patient questions:  Do you have a fever, pain , or abdominal swelling? No. Pain Score  0 *  Have you tolerated food without any problems? Yes.    Have you been able to return to your normal activities? Yes.    Do you have any questions about your discharge instructions: Diet   No. Medications  No. Follow up visit  No.  Do you have questions or concerns about your Care? No.  Actions: * If pain score is 4 or above: No action needed, pain <4.  1. Have you developed a fever since your procedure? no  2.   Have you had an respiratory symptoms (SOB or cough) since your procedure? no  3.   Have you tested positive for COVID 19 since your procedure no  4.   Have you had any family members/close contacts diagnosed with the COVID 19 since your procedure?  no   If yes to any of these questions please route to Joylene John, RN and Joella Prince, RN

## 2020-12-24 ENCOUNTER — Other Ambulatory Visit: Payer: Self-pay

## 2020-12-24 ENCOUNTER — Ambulatory Visit (INDEPENDENT_AMBULATORY_CARE_PROVIDER_SITE_OTHER): Payer: Medicare HMO | Admitting: Physician Assistant

## 2020-12-24 VITALS — BP 131/68 | HR 57 | Temp 97.8°F | Resp 20

## 2020-12-24 DIAGNOSIS — E538 Deficiency of other specified B group vitamins: Secondary | ICD-10-CM

## 2020-12-24 DIAGNOSIS — M1711 Unilateral primary osteoarthritis, right knee: Secondary | ICD-10-CM | POA: Diagnosis not present

## 2020-12-24 MED ORDER — CYANOCOBALAMIN 1000 MCG/ML IJ SOLN
1000.0000 ug | Freq: Once | INTRAMUSCULAR | Status: AC
  Administered 2020-12-24: 1000 ug via INTRAMUSCULAR

## 2020-12-24 NOTE — Progress Notes (Signed)
Established Patient Office Visit  Subjective:  Patient ID: Jeffery Anthony, male    DOB: 07/25/1951  Age: 70 y.o. MRN: [id number]  CC:  Chief Complaint  Patient presents with  . Pernicious Anemia    HPI Jeffery Anthony presents for B-12 injection.  Past Medical History:  Diagnosis Date  . Arthritis    knees  . GERD (gastroesophageal reflux disease)    past hx   . H/O Guillain-Barre syndrome    almost 30 yrs ago- ~ 17 or so -- age 48 -- rx to flu vax   . Headache    migraines  . Hyperlipidemia    borderline- on Atorvastatin    Past Surgical History:  Procedure Laterality Date  . COLONOSCOPY    . KNEE ARTHROSCOPY Bilateral 1992, 2008  . POLYPECTOMY    . ruptured achilles tendon Left 1992  . TONSILLECTOMY AND ADENOIDECTOMY  1961  . 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 Hyperlipidemia Father   . Hypertension Father   . Heart attack Father   . Valvular heart disease Father   . Other Father        somr part of intestines  removed- ? etiology   . Colon polyps Father   . Colon cancer Neg Hx   . Esophageal cancer Neg Hx   . Rectal cancer Neg Hx   . Stomach cancer Neg Hx     Social History   Socioeconomic History  . Marital status: Married    Spouse name: Not on file  . Number of children: Not on file  . Years of education: Not on file  . Highest education level: Not on file  Occupational History  . Not on file  Tobacco Use  . Smoking status: Never Smoker  . Smokeless tobacco: Never Used  Vaping Use  . Vaping Use: Never used  Substance and Sexual Activity  . Alcohol use: Yes    Alcohol/week: 1.0 standard drink    Types: 1 Glasses of wine per week  . Drug use: No  . Sexual activity: Yes  Other Topics Concern  . Not on file  Social History Narrative  . Not on file    Social Determinants of Health   Financial Resource Strain: Not on file  Food Insecurity: Not on file  Transportation Needs: Not on file  Physical Activity: Not on file  Stress: Not on file  Social Connections: Not on file  Intimate Partner Violence: Not on file    Outpatient Medications Prior to Visit  Medication Sig Dispense Refill  . aspirin EC 81 MG tablet Take 81 mg by mouth daily.    Marland Kitchen aspirin-acetaminophen-caffeine (EXCEDRIN MIGRAINE) 250-250-65 MG tablet Take 1 tablet by mouth every 6 (six) hours as needed for migraine.    Marland Kitchen atorvastatin (LIPITOR) 40 MG tablet Take 1 tablet (40 mg total) by mouth daily. 90 tablet 3  . meloxicam (MOBIC) 15 MG tablet Take 1 tablet (15 mg total) by mouth daily. 30 tablet 1  . sildenafil (VIAGRA) 100 MG tablet Take 1 tablet (100 mg total) by mouth as needed for erectile dysfunction (for use prior to sexual activity). 10 tablet 2  . vitamin B-12 (CYANOCOBALAMIN) 1000 MCG tablet Take 1,000 mcg by mouth daily.     No facility-administered medications prior to visit.  Allergies  Allergen Reactions  . Iodinated Diagnostic Agents Nausea And Vomiting and Other (See Comments)    iodine dye    ROS Review of Systems    Objective:    Physical Exam  BP 131/68 (BP Location: Right Arm, Patient Position: Sitting, Cuff Size: Normal)   Pulse (!) 57   Temp 97.8 F (36.6 C) (Oral)   Resp 20   SpO2 99%  Wt Readings from Last 3 Encounters:  12/13/20 228 lb (103.4 kg)  11/29/20 228 lb (103.4 kg)  11/26/20 224 lb (101.6 kg)     There are no preventive care reminders to display for this patient.  There are no preventive care reminders to display for this patient.  Lab Results  Component Value Date   TSH 2.54 12/11/2019   Lab Results  Component Value Date   WBC 4.8 12/11/2019   HGB 14.1 12/11/2019   HCT 39.7 12/11/2019   MCV 116.8 (H) 12/11/2019   PLT 224 12/11/2019   Lab Results  Component Value Date   NA 141 08/01/2020   K 4.3  08/01/2020   CO2 25 08/01/2020   GLUCOSE 101 08/01/2020   BUN 19 08/01/2020   CREATININE 1.06 08/01/2020   BILITOT 0.6 08/01/2020   ALKPHOS 44 09/09/2018   AST 16 08/01/2020   ALT 12 08/01/2020   PROT 6.2 08/01/2020   ALBUMIN 4.4 09/09/2018   CALCIUM 9.1 08/01/2020   ANIONGAP 7 09/09/2018   Lab Results  Component Value Date   CHOL 181 08/01/2020   Lab Results  Component Value Date   HDL 46 08/01/2020   Lab Results  Component Value Date   LDLCALC 118 (H) 08/01/2020   Lab Results  Component Value Date   TRIG 77 08/01/2020   Lab Results  Component Value Date   CHOLHDL 3.9 08/01/2020   Lab Results  Component Value Date   HGBA1C 5.5 08/01/2020      Assessment & Plan:  Injection given without complications. Pt will return in one month.  Problem List Items Addressed This Visit      Other   B12 deficiency - Primary      Meds ordered this encounter  Medications  . cyanocobalamin ((VITAMIN B-12)) injection 1,000 mcg    Follow-up: Return in about 1 month (around 01/24/2021) for B-12 Injection.    [name], CMA

## 2020-12-26 ENCOUNTER — Other Ambulatory Visit: Payer: Self-pay

## 2020-12-26 ENCOUNTER — Ambulatory Visit (INDEPENDENT_AMBULATORY_CARE_PROVIDER_SITE_OTHER): Payer: Medicare HMO

## 2020-12-26 ENCOUNTER — Ambulatory Visit (INDEPENDENT_AMBULATORY_CARE_PROVIDER_SITE_OTHER): Payer: Medicare HMO | Admitting: Sports Medicine

## 2020-12-26 DIAGNOSIS — M7542 Impingement syndrome of left shoulder: Secondary | ICD-10-CM | POA: Diagnosis not present

## 2020-12-26 DIAGNOSIS — M19012 Primary osteoarthritis, left shoulder: Secondary | ICD-10-CM

## 2020-12-26 NOTE — Progress Notes (Signed)
    Procedures performed today:    Procedure: Real-time Ultrasound Guided injection of the left subacromial bursa Device: Samsung HS60  Verbal informed consent obtained.  Time-out conducted.  Noted no overlying erythema, induration, or other signs of local infection.  Skin prepped in a sterile fashion.  Local anesthesia: Topical Ethyl chloride.  With sterile technique and under real time ultrasound guidance:  Mild bursitis, there was a bit of minor partial-thickness rotator cuff tearing at the rotator cuff interval but nothing full-thickness or retracted, 1 cc Kenalog 40, 1 cc lidocaine, 1 cc bupivacaine injected easily Completed without difficulty  Advised to call if fevers/chills, erythema, induration, drainage, or persistent bleeding.  Images permanently stored and available for review in PACS.  Impression: Technically successful ultrasound guided injection.  Independent interpretation of notes and tests performed by another provider:   X-rays personally viewed, mild glenohumeral osteoarthritis, moderate acromioclavicular osteoarthritis.  Brief History, Exam, Impression, and Recommendations:    Impingement syndrome, shoulder, left Rush Landmark is a pleasant 70 year old male, he has been helping his father rebuild the dock at their house down to [employer].  Unfortunately he is experiencing greater pain in his shoulder, left-sided, worse with abduction and overhead activities, oral NSAIDs and rehab exercises have not been efficacious. We did a subacromial injection today, he is going back to the beach to finish the dock on Monday. Return to see me in 6 weeks.    ___________________________________________ Gwen Her. Dianah Field, M.D., ABFM., CAQSM. Primary Care and [place] Instructor of [place] of [facility]

## 2020-12-26 NOTE — Assessment & Plan Note (Addendum)
Rush Landmark is a pleasant 70 year old male, he has been helping his father rebuild the dock at their house [place] to [employer].  Unfortunately he is experiencing greater pain in his shoulder, left-sided, worse with abduction and overhead activities, oral NSAIDs and rehab exercises have not been efficacious. We did a subacromial injection today, he is going back to the beach to [place] the dock on Monday. Return to see me in 6 weeks.

## 2021-01-27 ENCOUNTER — Ambulatory Visit: Payer: Medicare HMO

## 2021-02-04 ENCOUNTER — Other Ambulatory Visit: Payer: Self-pay

## 2021-02-04 ENCOUNTER — Ambulatory Visit (INDEPENDENT_AMBULATORY_CARE_PROVIDER_SITE_OTHER): Payer: Medicare HMO | Admitting: Physician Assistant

## 2021-02-04 VITALS — BP 132/80 | HR 57 | Temp 98.0°F | Resp 20 | Ht 71.0 in | Wt 225.0 lb

## 2021-02-04 DIAGNOSIS — E538 Deficiency of other specified B group vitamins: Secondary | ICD-10-CM | POA: Diagnosis not present

## 2021-02-04 DIAGNOSIS — D510 Vitamin B12 deficiency anemia due to intrinsic factor deficiency: Secondary | ICD-10-CM | POA: Diagnosis not present

## 2021-02-04 MED ORDER — CYANOCOBALAMIN 1000 MCG/ML IJ SOLN
1000.0000 ug | Freq: Once | INTRAMUSCULAR | Status: AC
  Administered 2021-02-04: 1000 ug via INTRAMUSCULAR

## 2021-02-04 NOTE — Progress Notes (Signed)
Established Patient Office Visit  Subjective:  Patient ID: Jeffery Anthony, male    DOB: [birth year]  Age: 70 y.o. MRN: [id number]  CC:  Chief Complaint  Patient presents with  . B12 deficiency    HPI [name] presents for a vitamin B12 injection. Denies gastrointestinal problems or dizziness. B12 injection to right deltoid with no apparent complications. Patient advised to schedule next injection in 30 days.   Past Medical History:  Diagnosis Date  . Arthritis    knees  . GERD (gastroesophageal reflux disease)    past hx   . H/O Guillain-Barre syndrome    almost 30 yrs ago- ~ 21 or so -- age 38 -- rx to flu vax   . Headache    migraines  . Hyperlipidemia    borderline- on Atorvastatin    Past Surgical History:  Procedure Laterality Date  . COLONOSCOPY    . KNEE ARTHROSCOPY Bilateral 1992, 2008  . POLYPECTOMY    . ruptured achilles tendon Left 1992  . TONSILLECTOMY AND ADENOIDECTOMY  1961  . 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 Hyperlipidemia Father   . Hypertension Father   . Heart attack Father   . Valvular heart disease Father   . Other Father        somr part of intestines  removed- ? etiology   . Colon polyps Father   . Colon cancer Neg Hx   . Esophageal cancer Neg Hx   . Rectal cancer Neg Hx   . Stomach cancer Neg Hx     Social History   Socioeconomic History  . Marital status: Married    Spouse name: Not on file  . Number of children: Not on file  . Years of education: Not on file  . Highest education level: Not on file  Occupational History  . Not on file  Tobacco Use  . Smoking status: Never Smoker  . Smokeless tobacco: Never Used  Vaping Use  . Vaping Use: Never used  Substance and Sexual Activity  . Alcohol use: Yes    Alcohol/week: 1.0 standard  drink    Types: 1 Glasses of wine per week  . Drug use: No  . Sexual activity: Yes  Other Topics Concern  . Not on file  Social History Narrative  . Not on file   Social Determinants of Health   Financial Resource Strain: Not on file  Food Insecurity: Not on file  Transportation Needs: Not on file  Physical Activity: Not on file  Stress: Not on file  Social Connections: Not on file  Intimate Partner Violence: Not on file    Outpatient Medications Prior to Visit  Medication Sig Dispense Refill  . aspirin EC 81 MG tablet Take 81 mg by mouth daily.    Marland Kitchen aspirin-acetaminophen-caffeine (EXCEDRIN MIGRAINE) 250-250-65 MG tablet Take 1 tablet by mouth every 6 (six) hours as needed for migraine.    Marland Kitchen atorvastatin (LIPITOR) 40 MG tablet Take 1 tablet (40 mg total) by mouth daily. 90 tablet 3  . meloxicam (MOBIC) 15 MG tablet Take 1 tablet (15 mg total) by mouth daily. 30 tablet 1  . sildenafil (VIAGRA) 100 MG tablet Take 1 tablet (100 mg total) by mouth as needed for erectile dysfunction (for use prior to sexual activity). 10 tablet 2  .  vitamin B-12 (CYANOCOBALAMIN) 1000 MCG tablet Take 1,000 mcg by mouth daily.     No facility-administered medications prior to visit.    Allergies  Allergen Reactions  . Iodinated Diagnostic Agents Nausea And Vomiting and Other (See Comments)    iodine dye    ROS Review of Systems    Objective:    Physical Exam  BP 132/80 (BP Location: Left Arm, Patient Position: Sitting, Cuff Size: Large)   Pulse (!) 57   Temp 98 F (36.7 C) (Oral)   Resp 20   Ht 5\' 11"  (1.803 m)   Wt 225 lb (102.1 kg)   SpO2 99%   BMI 31.38 kg/m  Wt Readings from Last 3 Encounters:  02/04/21 225 lb (102.1 kg)  12/13/20 228 lb (103.4 kg)  11/29/20 228 lb (103.4 kg)     There are no preventive care reminders to display for this patient.  There are no preventive care reminders to display for this patient.  Lab Results  Component Value Date   TSH 2.54  12/11/2019   Lab Results  Component Value Date   WBC 4.8 12/11/2019   HGB 14.1 12/11/2019   HCT 39.7 12/11/2019   MCV 116.8 (H) 12/11/2019   PLT 224 12/11/2019   Lab Results  Component Value Date   NA 141 08/01/2020   K 4.3 08/01/2020   CO2 25 08/01/2020   GLUCOSE 101 08/01/2020   BUN 19 08/01/2020   CREATININE 1.06 08/01/2020   BILITOT 0.6 08/01/2020   ALKPHOS 44 09/09/2018   AST 16 08/01/2020   ALT 12 08/01/2020   PROT 6.2 08/01/2020   ALBUMIN 4.4 09/09/2018   CALCIUM 9.1 08/01/2020   ANIONGAP 7 09/09/2018   Lab Results  Component Value Date   CHOL 181 08/01/2020   Lab Results  Component Value Date   HDL 46 08/01/2020   Lab Results  Component Value Date   LDLCALC 118 (H) 08/01/2020   Lab Results  Component Value Date   TRIG 77 08/01/2020   Lab Results  Component Value Date   CHOLHDL 3.9 08/01/2020   Lab Results  Component Value Date   HGBA1C 5.5 08/01/2020      Assessment & Plan:  B12 injection to right deltoid with no apparent complications. Patient advised to schedule next injection in 30 days.  Problem List Items Addressed This Visit      Other   B12 deficiency   Pernicious anemia - Primary      Meds ordered this encounter  Medications  . cyanocobalamin ((VITAMIN B-12)) injection 1,000 mcg    Follow-up: Return in about 1 month (around 03/06/2021) for B12 Injection.    Jeffery Anthony, CMA

## 2021-02-04 NOTE — Progress Notes (Signed)
Patient ID: Jeffery Anthony, male   DOB: 08/14/1951, 70 y.o.   MRN: [id number] Agree with above plan.

## 2021-02-06 ENCOUNTER — Ambulatory Visit (INDEPENDENT_AMBULATORY_CARE_PROVIDER_SITE_OTHER): Payer: Medicare HMO

## 2021-02-06 ENCOUNTER — Ambulatory Visit (INDEPENDENT_AMBULATORY_CARE_PROVIDER_SITE_OTHER): Payer: Medicare HMO | Admitting: Sports Medicine

## 2021-02-06 ENCOUNTER — Other Ambulatory Visit: Payer: Self-pay

## 2021-02-06 DIAGNOSIS — G8929 Other chronic pain: Secondary | ICD-10-CM

## 2021-02-06 DIAGNOSIS — M25512 Pain in left shoulder: Secondary | ICD-10-CM | POA: Diagnosis not present

## 2021-02-06 NOTE — Assessment & Plan Note (Signed)
Jeffery Anthony returns, he is a very pleasant 70 year old male with chronic left shoulder pain, initially impingement type symptoms, we injected the subacromial bursa initially had minimal improvement. Still has pain that he localizes laterally over the deltoid as well as anteriorly at the glenohumeral joint line. 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 Today we did an injection into the glenohumeral joint, if this fails then the next up will be MRI and surgical referral.

## 2021-02-06 NOTE — Progress Notes (Signed)
    Procedures performed today:    Procedure: Real-time Ultrasound Guided injection of the left glenohumeral joint Device: Samsung [id number]  Verbal informed consent obtained.  Time-out conducted.  Noted no overlying erythema, induration, or other signs of local infection.  Skin prepped in a sterile fashion.  Local anesthesia: Topical Ethyl chloride.  With sterile technique and under real time ultrasound guidance:  Noted osteophytic spurring, 1 cc Kenalog 40, 2 cc lidocaine, 2 cc bupivacaine injected easily Completed without difficulty  Advised to call if fevers/chills, erythema, induration, drainage, or persistent bleeding.  Images permanently stored and available for review in PACS.  Impression: Technically successful ultrasound guided injection.  Independent interpretation of notes and tests performed by another provider:   None.  Brief History, Exam, Impression, and Recommendations:    Chronic left shoulder pain Jeffery Anthony returns, he is a very pleasant 70 year old male with chronic left shoulder pain, initially impingement type symptoms, we injected the subacromial bursa initially had minimal improvement. Still has pain that he localizes laterally over the deltoid as well as anteriorly at the glenohumeral joint line. 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 Today we did an injection into the glenohumeral joint, if this fails then the next up will be MRI and surgical referral.    ___________________________________________ Gwen Her. Dianah Field, M.D., ABFM., CAQSM. Primary Care and [place] Instructor of [employer] [facility] [employer]

## 2021-02-07 DIAGNOSIS — G8929 Other chronic pain: Secondary | ICD-10-CM

## 2021-02-07 DIAGNOSIS — M25512 Pain in left shoulder: Secondary | ICD-10-CM

## 2021-02-12 DIAGNOSIS — M25612 Stiffness of left shoulder, not elsewhere classified: Secondary | ICD-10-CM | POA: Diagnosis not present

## 2021-02-12 DIAGNOSIS — M6281 Muscle weakness (generalized): Secondary | ICD-10-CM | POA: Diagnosis not present

## 2021-02-12 DIAGNOSIS — M25512 Pain in left shoulder: Secondary | ICD-10-CM | POA: Diagnosis not present

## 2021-02-14 DIAGNOSIS — M25612 Stiffness of left shoulder, not elsewhere classified: Secondary | ICD-10-CM | POA: Diagnosis not present

## 2021-02-14 DIAGNOSIS — M6281 Muscle weakness (generalized): Secondary | ICD-10-CM | POA: Diagnosis not present

## 2021-02-14 DIAGNOSIS — M25512 Pain in left shoulder: Secondary | ICD-10-CM | POA: Diagnosis not present

## 2021-03-04 DIAGNOSIS — M25512 Pain in left shoulder: Secondary | ICD-10-CM | POA: Diagnosis not present

## 2021-03-04 DIAGNOSIS — M6281 Muscle weakness (generalized): Secondary | ICD-10-CM | POA: Diagnosis not present

## 2021-03-04 DIAGNOSIS — M25612 Stiffness of left shoulder, not elsewhere classified: Secondary | ICD-10-CM | POA: Diagnosis not present

## 2021-03-06 ENCOUNTER — Other Ambulatory Visit: Payer: Self-pay

## 2021-03-06 ENCOUNTER — Ambulatory Visit (INDEPENDENT_AMBULATORY_CARE_PROVIDER_SITE_OTHER): Payer: Medicare HMO | Admitting: Family Medicine

## 2021-03-06 ENCOUNTER — Ambulatory Visit: Payer: Medicare HMO | Admitting: Sports Medicine

## 2021-03-06 VITALS — BP 117/72 | HR 86 | Temp 98.7°F | Resp 20 | Ht 71.0 in | Wt 225.0 lb

## 2021-03-06 DIAGNOSIS — D510 Vitamin B12 deficiency anemia due to intrinsic factor deficiency: Secondary | ICD-10-CM

## 2021-03-06 DIAGNOSIS — E538 Deficiency of other specified B group vitamins: Secondary | ICD-10-CM

## 2021-03-06 DIAGNOSIS — M25612 Stiffness of left shoulder, not elsewhere classified: Secondary | ICD-10-CM | POA: Diagnosis not present

## 2021-03-06 DIAGNOSIS — M6281 Muscle weakness (generalized): Secondary | ICD-10-CM | POA: Diagnosis not present

## 2021-03-06 DIAGNOSIS — M25512 Pain in left shoulder: Secondary | ICD-10-CM | POA: Diagnosis not present

## 2021-03-06 IMAGING — DX DG KNEE COMPLETE 4+V*R*
6 series · 6 of 6 positions shown · non-contrast
Comparison: Bilateral knee x-rays dated June 28, 2013.

CLINICAL DATA: Bilateral knee pain. Right knee steroid injection
earlier today.

EXAM:
LEFT KNEE - COMPLETE 4+ VIEW; RIGHT KNEE - COMPLETE 4+ VIEW

[knee lat (1 of 2)]
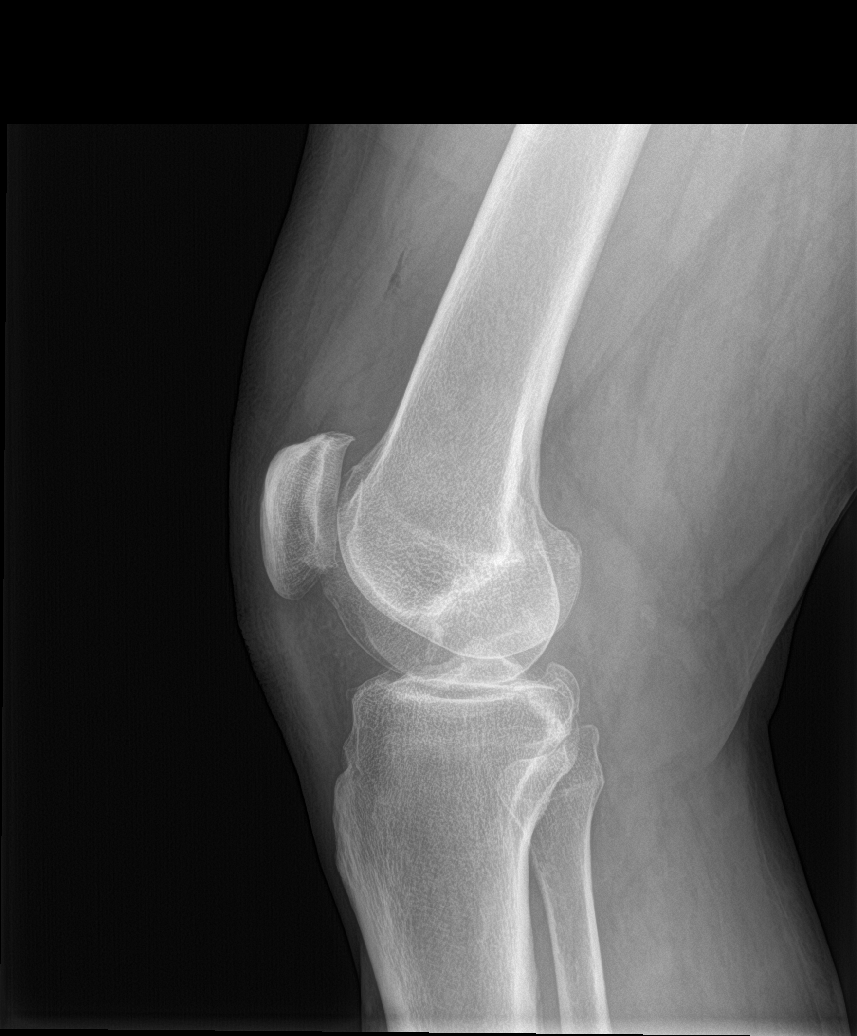

[knee sunrise (1 of 2)]
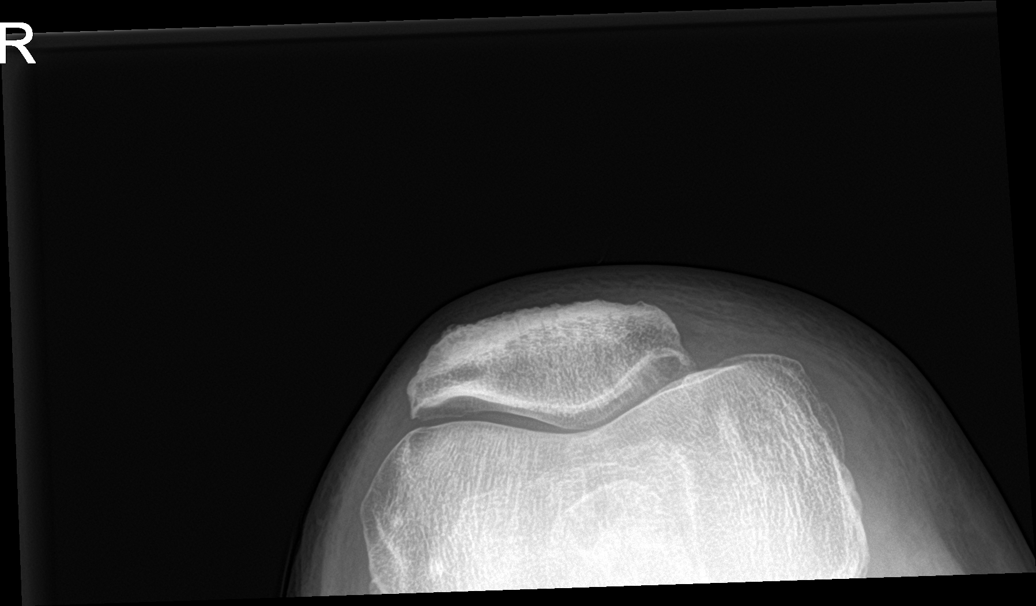

[knee ap bilat standing (1 of 2)]
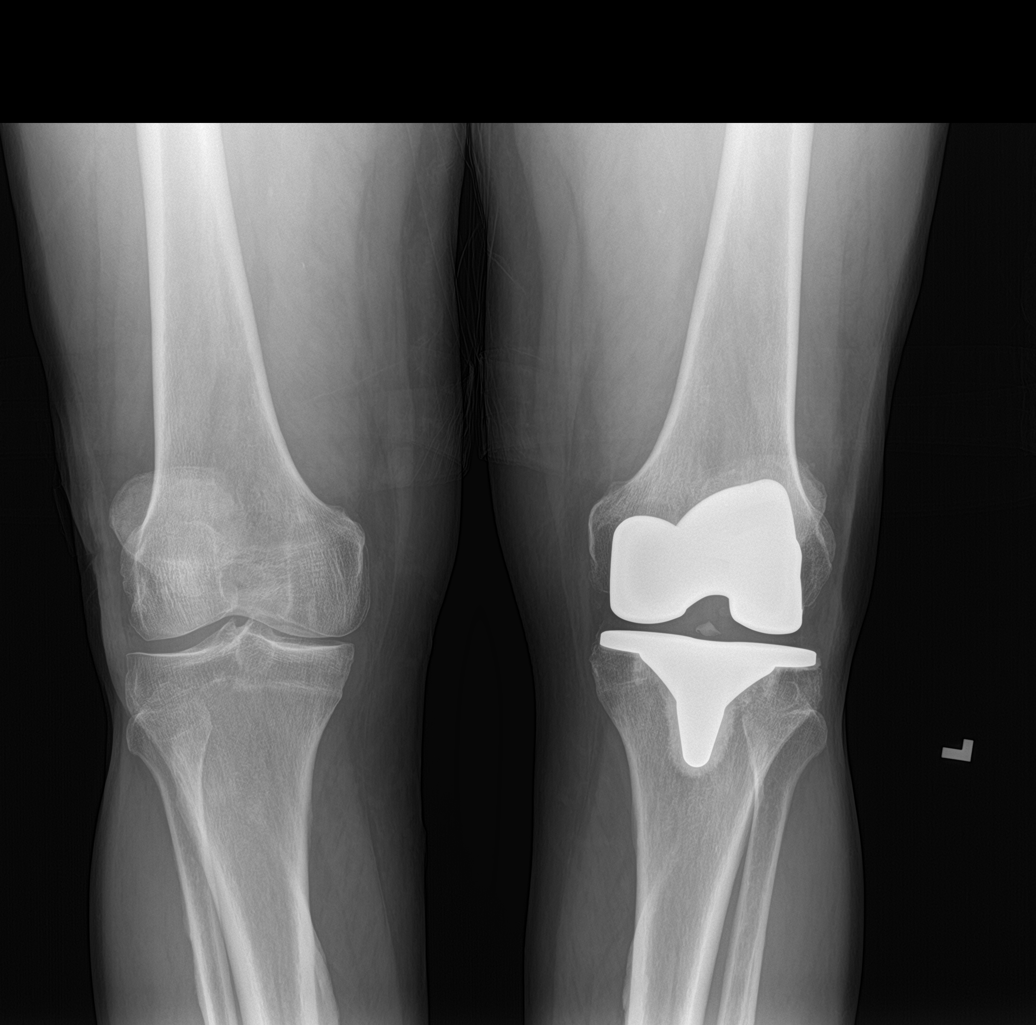

[knee ap bilat standing (2 of 2)]
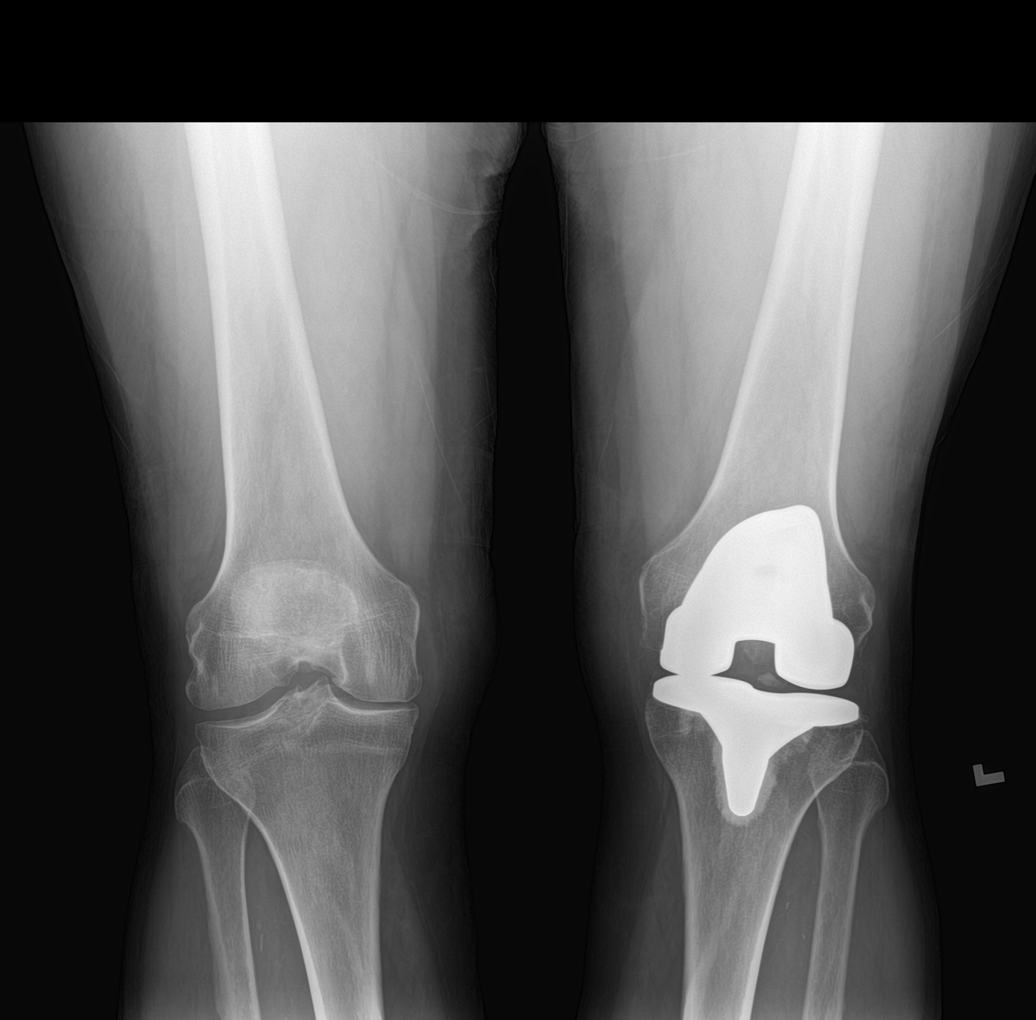

[knee lat (2 of 2)]
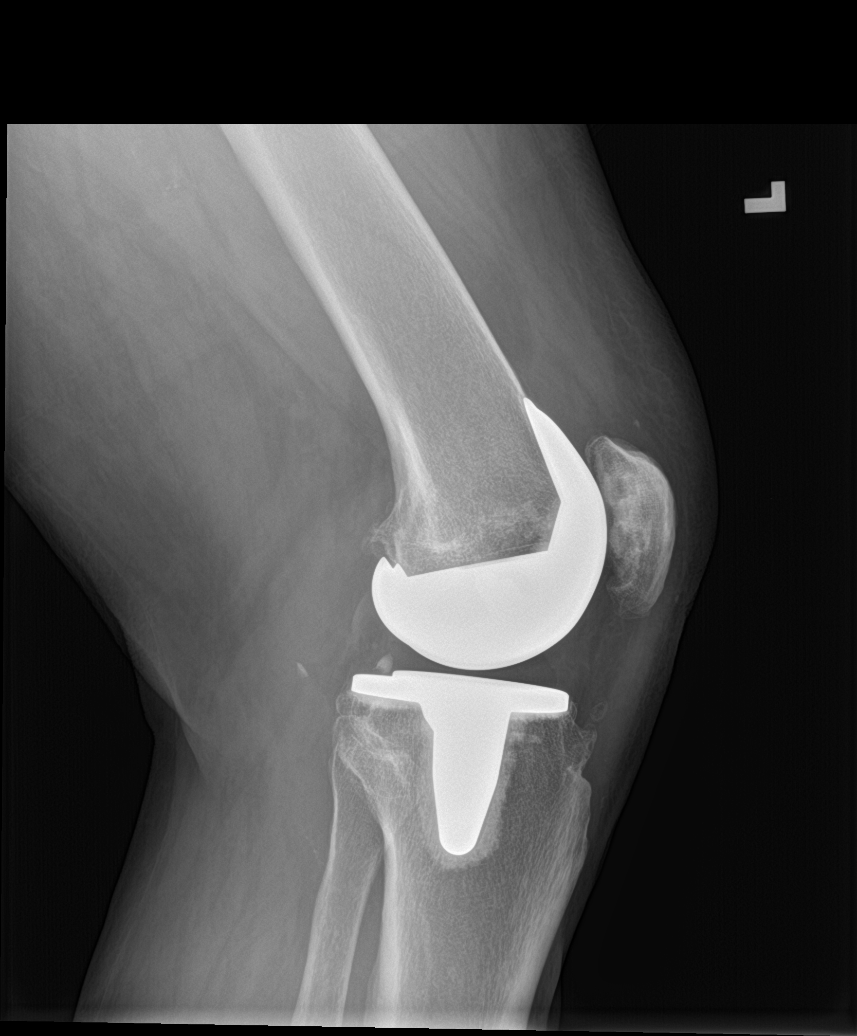

[knee sunrise (2 of 2)]
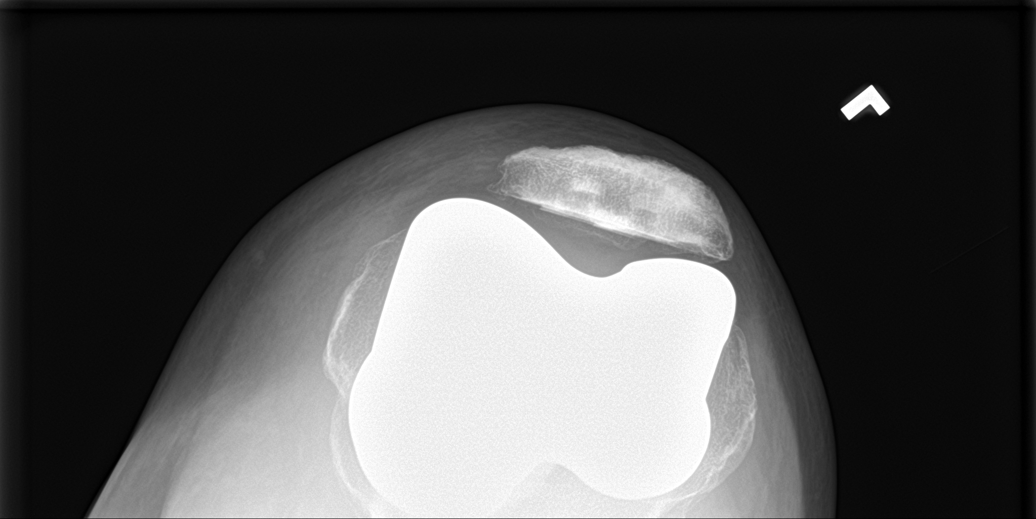

[6 of 6 positions shown; findings below may reference images not displayed]

FINDINGS: Left knee: Prior total arthroplasty. No evidence of hardware failure
or loosening. No joint effusion. Bone mineralization is normal. Soft
tissues are unremarkable.

Right knee: No acute fracture or malalignment. Small joint effusion
with small sliver of intra-articular air in the suprapatellar joint
space related to steroid injection earlier today. Progressive mild
to moderate medial compartment joint space narrow and small
tricompartmental marginal osteophytes. Mild patellofemoral
compartment joint space narrowing. Bone mineralization is normal.
Soft tissues are unremarkable.
IMPRESSION: 1. No acute osseous abnormality.
2. Left total knee arthroplasty without hardware complication.
3. Progressive right knee tricompartmental osteoarthritis,
mild-to-moderate in the medial compartment.

## 2021-03-06 IMAGING — DX DG KNEE COMPLETE 4+V*L*
6 series · 6 of 6 positions shown · non-contrast
Comparison: Bilateral knee x-rays dated June 28, 2013.

CLINICAL DATA: Bilateral knee pain. Right knee steroid injection
earlier today.

EXAM:
LEFT KNEE - COMPLETE 4+ VIEW; RIGHT KNEE - COMPLETE 4+ VIEW

[knee lat (1 of 2)]
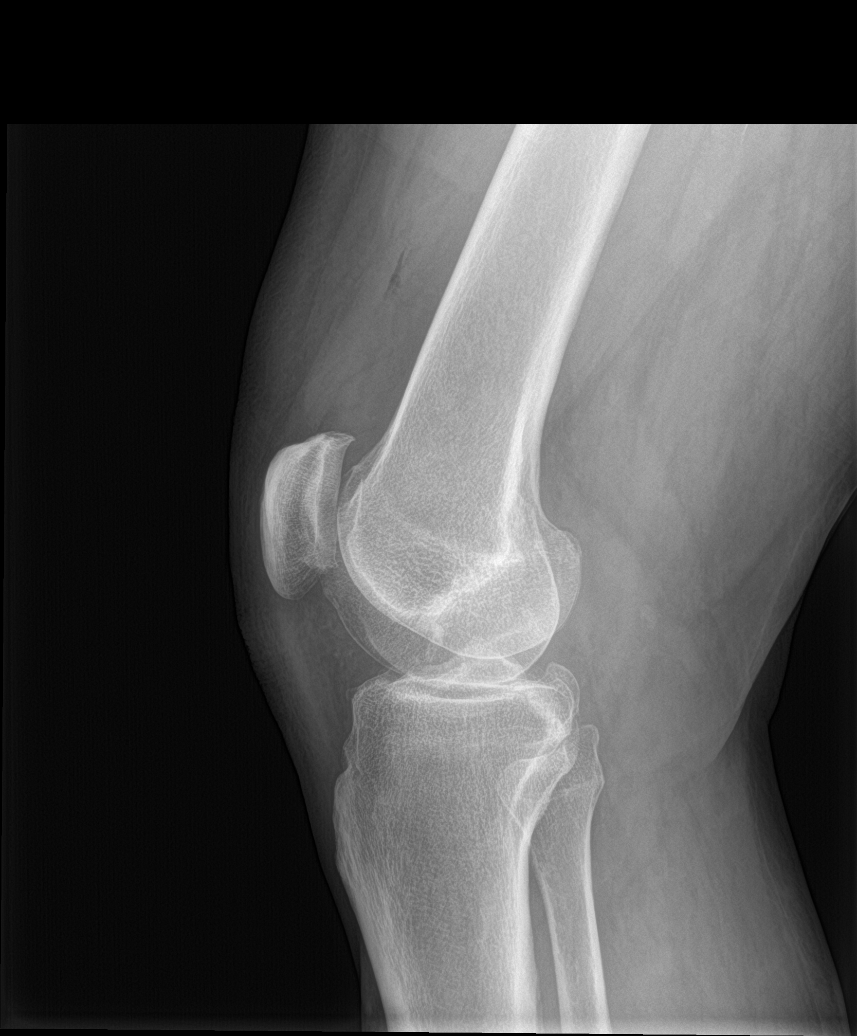

[knee sunrise (1 of 2)]
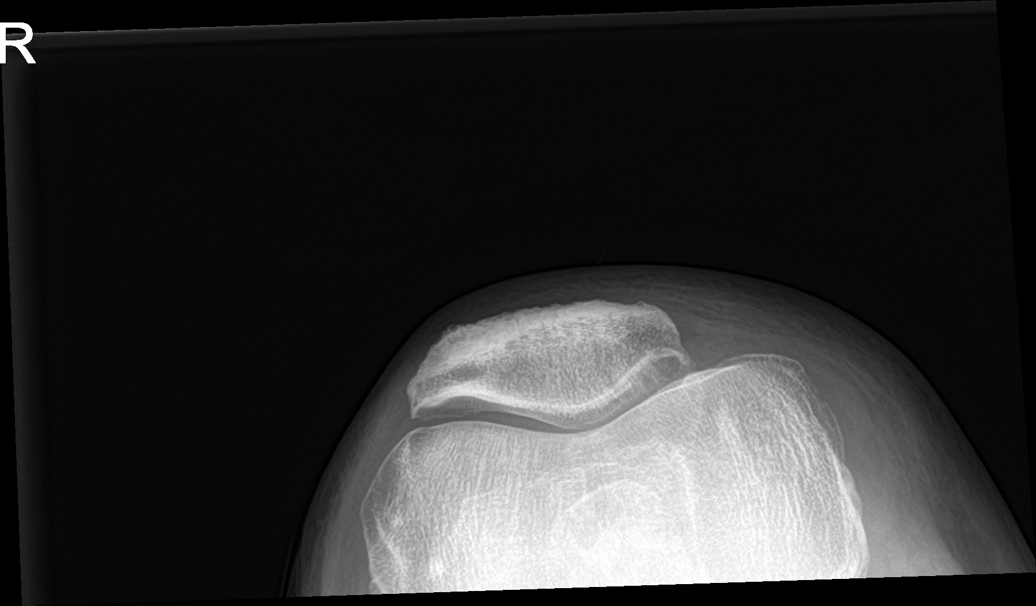

[knee ap bilat standing (1 of 2)]
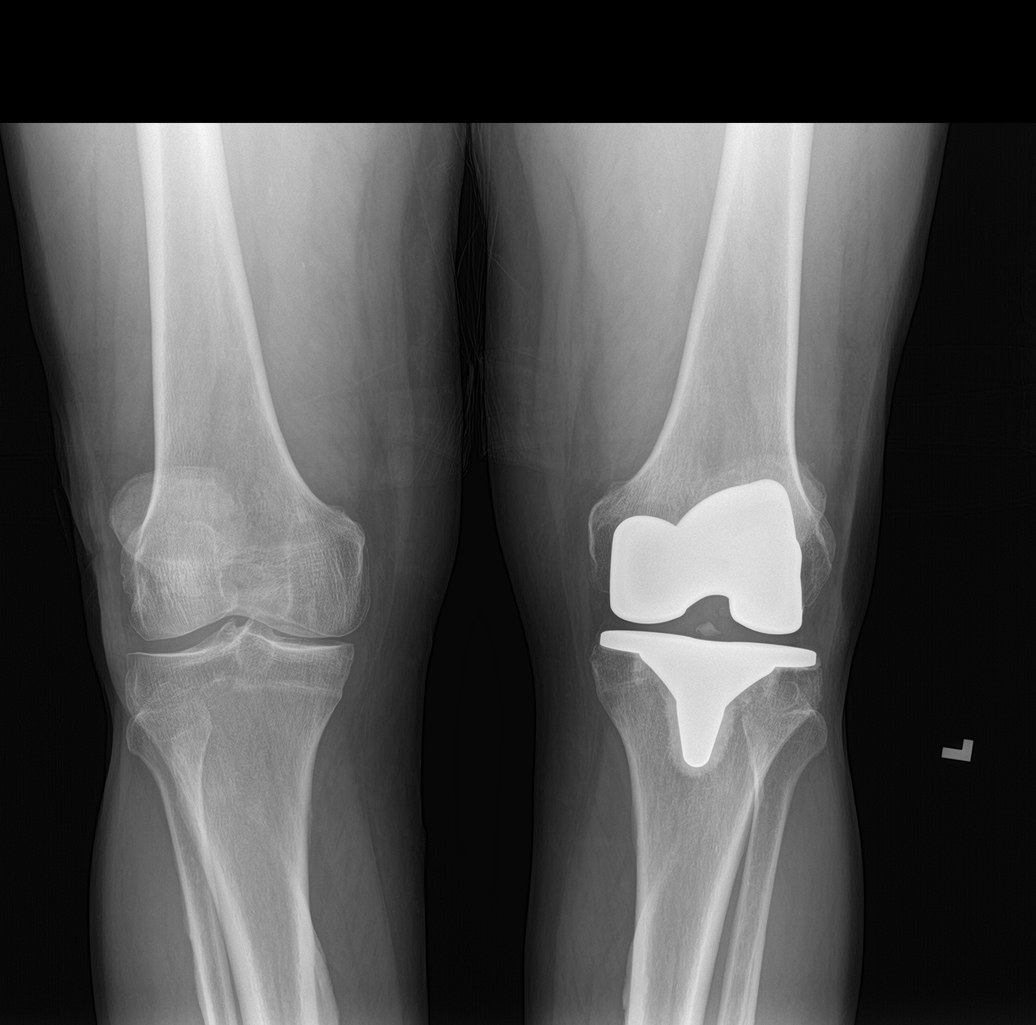

[knee ap bilat standing (2 of 2)]
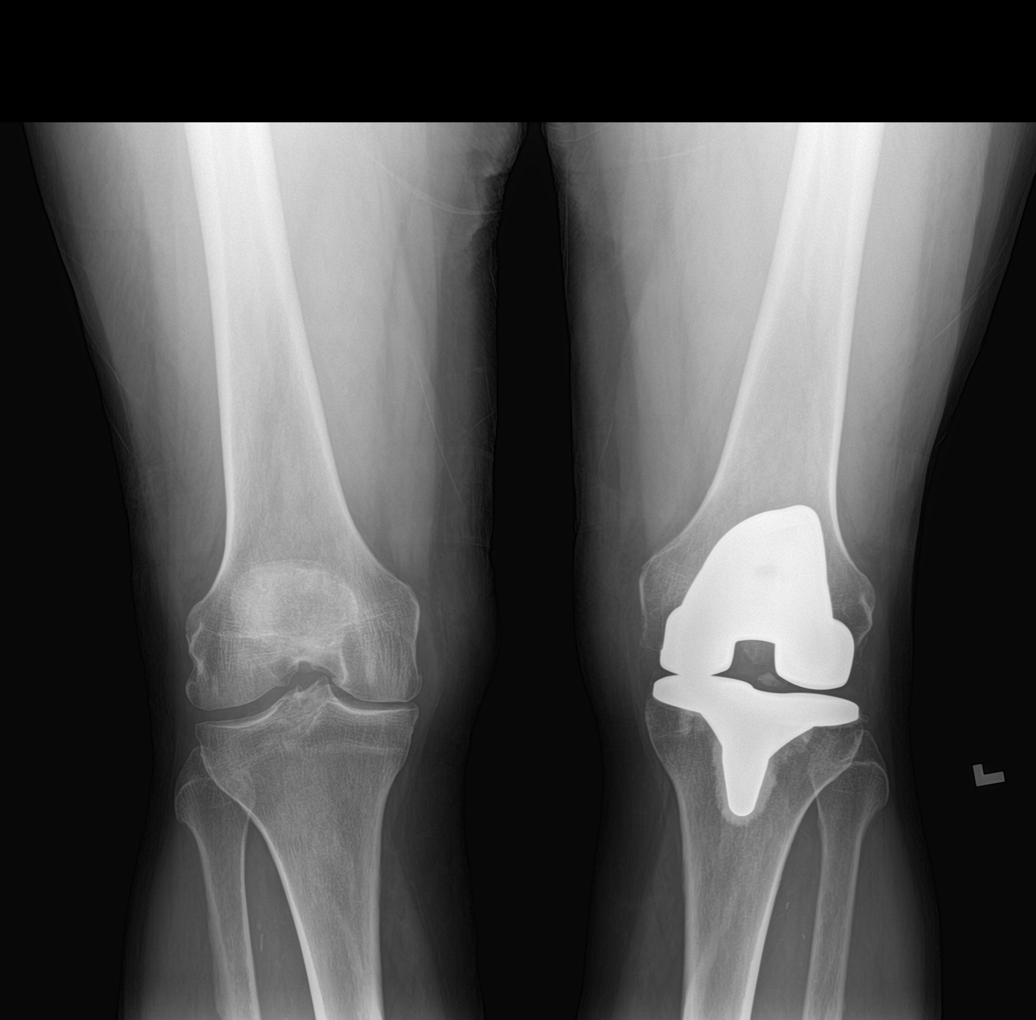

[knee lat (2 of 2)]
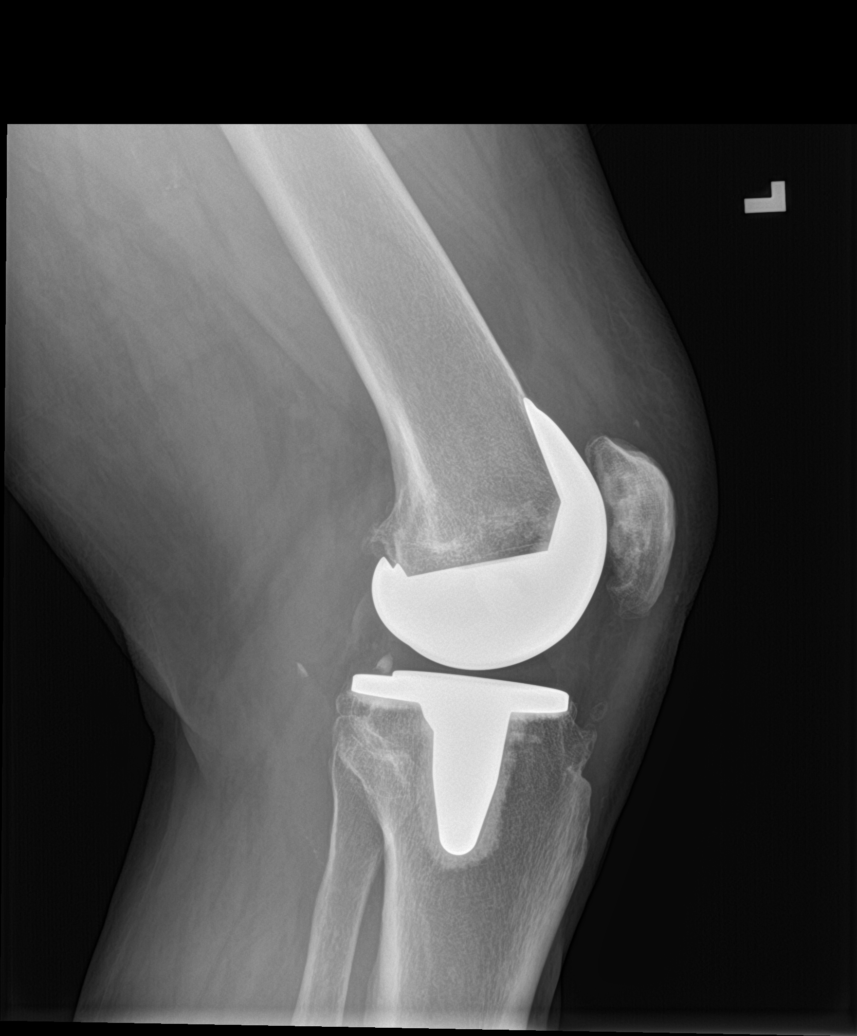

[knee sunrise (2 of 2)]
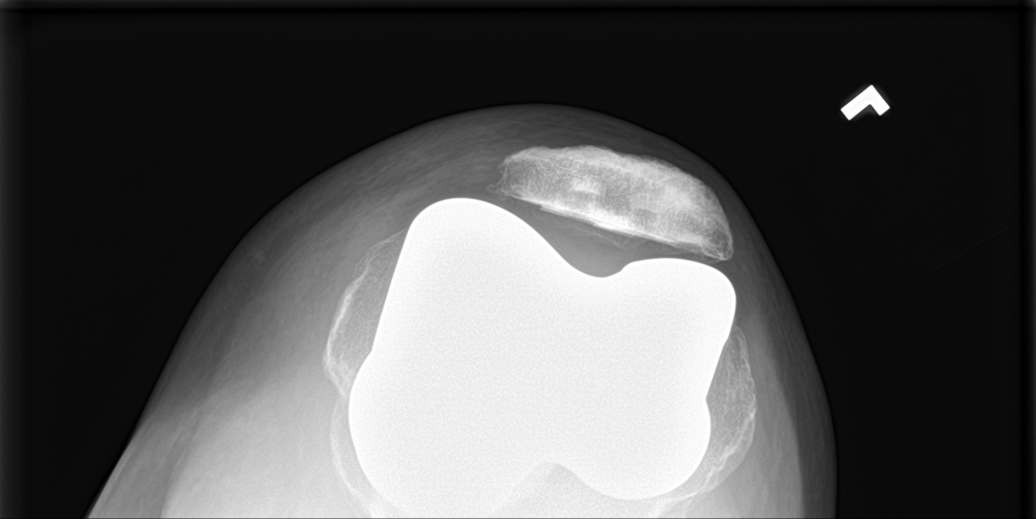

[6 of 6 positions shown; findings below may reference images not displayed]

FINDINGS: Left knee: Prior total arthroplasty. No evidence of hardware failure
or loosening. No joint effusion. Bone mineralization is normal. Soft
tissues are unremarkable.

Right knee: No acute fracture or malalignment. Small joint effusion
with small sliver of intra-articular air in the suprapatellar joint
space related to steroid injection earlier today. Progressive mild
to moderate medial compartment joint space narrow and small
tricompartmental marginal osteophytes. Mild patellofemoral
compartment joint space narrowing. Bone mineralization is normal.
Soft tissues are unremarkable.
IMPRESSION: 1. No acute osseous abnormality.
2. Left total knee arthroplasty without hardware complication.
3. Progressive right knee tricompartmental osteoarthritis,
mild-to-moderate in the medial compartment.

## 2021-03-06 MED ORDER — CYANOCOBALAMIN 1000 MCG/ML IJ SOLN
1000.0000 ug | Freq: Once | INTRAMUSCULAR | Status: AC
  Administered 2021-03-06: 1000 ug via INTRAMUSCULAR

## 2021-03-06 NOTE — Progress Notes (Signed)
Pernicious anemia-last level was drawn 7 months ago and was supratherapeutic.  He is actually supposed be getting injections every 6 weeks but is actually been every 30 days.  Please just call patient and let him know that we need to start spacing to every 6 weeks and right before his next injection if he can go to the lab and just have his level drawn so that we can see where things are at.  B12 lab entered.  thank you ,   CM

## 2021-03-06 NOTE — Progress Notes (Signed)
Established Patient Office Visit  Subjective:  Patient ID: Jeffery Anthony, male    DOB: [birth year]  Age: 70 y.o. MRN: [id number]  CC:  Chief Complaint  Patient presents with  . Pernicious Anemia    HPI Jeffery Anthony presents for a vitamin B12 injection. Denies gastrointestinal problems or dizziness. B12 injection to left deltoid with no apparent complications. Patient advised to schedule next injection in 30 days.   Past Medical History:  Diagnosis Date  . Arthritis    knees  . GERD (gastroesophageal reflux disease)    past hx   . H/O Guillain-Barre syndrome    almost 30 yrs ago- ~ 56 or so -- age 4 -- rx to flu vax   . Headache    migraines  . Hyperlipidemia    borderline- on Atorvastatin    Past Surgical History:  Procedure Laterality Date  . COLONOSCOPY    . KNEE ARTHROSCOPY Bilateral 1992, 2008  . POLYPECTOMY    . ruptured achilles tendon Left 1992  . TONSILLECTOMY AND ADENOIDECTOMY  1961  . 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 Hyperlipidemia Father   . Hypertension Father   . Heart attack Father   . Valvular heart disease Father   . Other Father        somr part of intestines  removed- ? etiology   . Colon polyps Father   . Colon cancer Neg Hx   . Esophageal cancer Neg Hx   . Rectal cancer Neg Hx   . Stomach cancer Neg Hx     Social History   Socioeconomic History  . Marital status: Married    Spouse name: Not on file  . Number of children: Not on file  . Years of education: Not on file  . Highest education level: Not on file  Occupational History  . Not on file  Tobacco Use  . Smoking status: Never Smoker  . Smokeless tobacco: Never Used  Vaping Use  . Vaping Use: Never used  Substance and Sexual Activity  . Alcohol use: Yes    Alcohol/week: 1.0 standard  drink    Types: 1 Glasses of wine per week  . Drug use: No  . Sexual activity: Yes  Other Topics Concern  . Not on file  Social History Narrative  . Not on file   Social Determinants of Health   Financial Resource Strain: Not on file  Food Insecurity: Not on file  Transportation Needs: Not on file  Physical Activity: Not on file  Stress: Not on file  Social Connections: Not on file  Intimate Partner Violence: Not on file    Outpatient Medications Prior to Visit  Medication Sig Dispense Refill  . aspirin EC 81 MG tablet Take 81 mg by mouth daily.    Marland Kitchen aspirin-acetaminophen-caffeine (EXCEDRIN MIGRAINE) 250-250-65 MG tablet Take 1 tablet by mouth every 6 (six) hours as needed for migraine.    Marland Kitchen atorvastatin (LIPITOR) 40 MG tablet Take 1 tablet (40 mg total) by mouth daily. 90 tablet 3  . meloxicam (MOBIC) 15 MG tablet Take 1 tablet (15 mg total) by mouth daily. 30 tablet 1  . sildenafil (VIAGRA) 100 MG tablet Take 1 tablet (100 mg total) by mouth as needed for erectile dysfunction (for use prior to sexual activity). 10 tablet 2  .  vitamin B-12 (CYANOCOBALAMIN) 1000 MCG tablet Take 1,000 mcg by mouth daily.     No facility-administered medications prior to visit.    Allergies  Allergen Reactions  . Iodinated Diagnostic Agents Nausea And Vomiting and Other (See Comments)    iodine dye    ROS Review of Systems    Objective:    Physical Exam  BP 117/72 (BP Location: Left Arm, Patient Position: Sitting, Cuff Size: Large)   Pulse 86   Temp 98.7 F (37.1 C) (Oral)   Resp 20   Ht 5\' 11"  (1.803 m)   Wt 225 lb (102.1 kg)   SpO2 96%   BMI 31.38 kg/m  Wt Readings from Last 3 Encounters:  03/06/21 225 lb (102.1 kg)  02/04/21 225 lb (102.1 kg)  12/13/20 228 lb (103.4 kg)     There are no preventive care reminders to display for this patient.  There are no preventive care reminders to display for this patient.  Lab Results  Component Value Date   TSH 2.54 12/11/2019    Lab Results  Component Value Date   WBC 4.8 12/11/2019   HGB 14.1 12/11/2019   HCT 39.7 12/11/2019   MCV 116.8 (H) 12/11/2019   PLT 224 12/11/2019   Lab Results  Component Value Date   NA 141 08/01/2020   K 4.3 08/01/2020   CO2 25 08/01/2020   GLUCOSE 101 08/01/2020   BUN 19 08/01/2020   CREATININE 1.06 08/01/2020   BILITOT 0.6 08/01/2020   ALKPHOS 44 09/09/2018   AST 16 08/01/2020   ALT 12 08/01/2020   PROT 6.2 08/01/2020   ALBUMIN 4.4 09/09/2018   CALCIUM 9.1 08/01/2020   ANIONGAP 7 09/09/2018   Lab Results  Component Value Date   CHOL 181 08/01/2020   Lab Results  Component Value Date   HDL 46 08/01/2020   Lab Results  Component Value Date   LDLCALC 118 (H) 08/01/2020   Lab Results  Component Value Date   TRIG 77 08/01/2020   Lab Results  Component Value Date   CHOLHDL 3.9 08/01/2020   Lab Results  Component Value Date   HGBA1C 5.5 08/01/2020      Assessment & Plan:  B12 injection to left deltoid with no apparent complications. Patient advised to schedule next injection in 30 days.  Problem List Items Addressed This Visit      Other   B12 deficiency - Primary   Relevant Medications   cyanocobalamin ((VITAMIN B-12)) injection 1,000 mcg (Start on 03/06/2021 10:00 AM)   Pernicious anemia   Relevant Medications   cyanocobalamin ((VITAMIN B-12)) injection 1,000 mcg (Start on 03/06/2021 10:00 AM)      Meds ordered this encounter  Medications  . cyanocobalamin ((VITAMIN B-12)) injection 1,000 mcg    Follow-up: Return in about 1 month (around 04/06/2021) for B12 Injection.    [name], CMA

## 2021-03-11 DIAGNOSIS — M25612 Stiffness of left shoulder, not elsewhere classified: Secondary | ICD-10-CM | POA: Diagnosis not present

## 2021-03-11 DIAGNOSIS — M6281 Muscle weakness (generalized): Secondary | ICD-10-CM | POA: Diagnosis not present

## 2021-03-11 DIAGNOSIS — M25512 Pain in left shoulder: Secondary | ICD-10-CM | POA: Diagnosis not present

## 2021-03-12 ENCOUNTER — Emergency Department
Admission: EM | Admit: 2021-03-12 | Discharge: 2021-03-12 | Disposition: A | Discharge status: Home / Self Care | Payer: Medicare HMO | Source: Home / Self Care

## 2021-03-12 ENCOUNTER — Ambulatory Visit: Payer: Medicare HMO | Admitting: Family Medicine

## 2021-03-12 ENCOUNTER — Other Ambulatory Visit: Payer: Self-pay

## 2021-03-12 DIAGNOSIS — Z7982 Long term (current) use of aspirin: Secondary | ICD-10-CM | POA: Diagnosis not present

## 2021-03-12 DIAGNOSIS — R6884 Jaw pain: Secondary | ICD-10-CM | POA: Diagnosis not present

## 2021-03-12 DIAGNOSIS — J0100 Acute maxillary sinusitis, unspecified: Secondary | ICD-10-CM | POA: Diagnosis not present

## 2021-03-12 DIAGNOSIS — R001 Bradycardia, unspecified: Secondary | ICD-10-CM | POA: Diagnosis not present

## 2021-03-12 DIAGNOSIS — Z888 Allergy status to other drugs, medicaments and biological substances status: Secondary | ICD-10-CM | POA: Diagnosis not present

## 2021-03-12 DIAGNOSIS — M542 Cervicalgia: Secondary | ICD-10-CM | POA: Diagnosis not present

## 2021-03-12 DIAGNOSIS — M5010 Cervical disc disorder with radiculopathy, unspecified cervical region: Secondary | ICD-10-CM

## 2021-03-12 DIAGNOSIS — J3489 Other specified disorders of nose and nasal sinuses: Secondary | ICD-10-CM | POA: Diagnosis not present

## 2021-03-12 DIAGNOSIS — Z79899 Other long term (current) drug therapy: Secondary | ICD-10-CM | POA: Diagnosis not present

## 2021-03-12 MED ORDER — AMOXICILLIN-POT CLAVULANATE 875-125 MG PO TABS: 1.0000 | ORAL_TABLET | Freq: Two times a day (BID) | ORAL | 0 refills | Status: DC

## 2021-03-12 MED ORDER — PREDNISONE 20 MG PO TABS: ORAL_TABLET | ORAL | 0 refills | Status: DC

## 2021-03-12 MED ORDER — METHYLPREDNISOLONE ACETATE 80 MG/ML IJ SUSP
80.0000 mg | Freq: Once | INTRAMUSCULAR | Status: AC
  Administered 2021-03-12: 80 mg via INTRAMUSCULAR

## 2021-03-12 NOTE — Discharge Instructions (Addendum)
Advised/instructed patient to start oral prednisone taper tomorrow, Thursday, 03/13/2021.  Advised patient to take medication with food to completion.  Instructed patient to start Augmentin today twice daily for the next 7 days, advised patient to take medication with food to completion; encourage patient to increase daily water intake while taking these medications.

## 2021-03-12 NOTE — ED Provider Notes (Signed)
[facility] C[facility]    CSN: [id number] Arrival date & time: 03/12/21  1003      History   Chief Complaint Chief Complaint  Patient presents with  . Facial Pain    RT side    HPI Derrious Bologna is a 70 y.o. male.   HPI    70 year old male presents with sinus nasal congestion, sinus pressure, right otalgia, and right-sided face and neck pain for 6 weeks.  Patient reports right side of face and neck pain have been present intermittently for 6 weeks; however, last night right-sided neck pain increased.  Reports OTC Tylenol and ibuprofen provided no relief. Patient has PMH of C7 radiculopathy, neck pain, migraine, right otalgia, and right ear vertigo  Past Medical History:  Diagnosis Date  . Arthritis    knees  . GERD (gastroesophageal reflux disease)    past hx   . H/O Guillain-Barre syndrome    almost 30 yrs ago- ~ 23 or so -- age 53 -- rx to flu vax   . Headache    migraines  . Hyperlipidemia    borderline- on Atorvastatin    Patient Active Problem List   Diagnosis Date Noted  . Acute pain of left shoulder 11/26/2020  . Rib pain on left side 11/26/2020  . Rash and nonspecific skin eruption 11/26/2020  . Chronic left shoulder pain 11/26/2020  . Closed fracture of one rib of left side 11/26/2020  . Elevated LDL cholesterol level 07/16/2020  . Seborrheic keratosis 07/16/2020  . C7 radiculopathy 02/07/2020  . Trouble in sleeping 01/10/2020  . Neck pain 01/10/2020  . Pernicious anemia 01/10/2020  . Pre-diabetes 12/13/2019  . Hyperlipidemia LDL goal <100 12/12/2019  . B12 deficiency 12/12/2019  . Orthostatic hypotension 12/11/2019  . Right arm pain 12/11/2019  . Family history of TIAs 12/11/2019  . Episode of dizziness 12/11/2019  . Memory changes 12/11/2019  . History of COVID-19 12/11/2019  . Pain in both hands 05/15/2019  . Numbness and tingling in both hands 05/15/2019  . Paresthesia of both hands 05/15/2019  . Status post left knee replacement  12/28/2018  . Low libido 12/28/2018  . 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  . Urinary frequency 12/28/2018  . Benign paroxysmal positional vertigo of right ear 11/17/2017  . 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 Migraine 12/01/2016  . Colon polyp 10/04/2013  . Primary osteoarthritis of both knees 07/10/2013    Past Surgical History:  Procedure Laterality Date  . COLONOSCOPY    . KNEE ARTHROSCOPY Bilateral 1992, 2008  . POLYPECTOMY    . ruptured achilles tendon Left 1992  . TONSILLECTOMY AND ADENOIDECTOMY  1961  . TOTAL KNEE ARTHROPLASTY Left 09/16/2018   Procedure: TOTAL KNEE ARTHROPLASTY;  Surgeon: Dorna Leitz, MD;  Location: [facility];  Service: Orthopedics;  Laterality: Left;       Home Medications    Prior to Admission medications   Medication Sig Start Date End Date Taking? Authorizing Provider  amoxicillin-clavulanate (AUGMENTIN) 875-125 MG tablet Take 1 tablet by mouth every 12 (twelve) hours. 03/12/21  Yes Eliezer Lofts, FNP  predniSONE (DELTASONE) 20 MG tablet Take 3 tabs PO daily x 3 days, then 2 tabs PO daily x 3 days, then 1 tab PO daily x 3 days 03/12/21  Yes Eliezer Lofts, FNP  aspirin EC 81 MG tablet Take 81 mg by mouth daily.    [provider]  aspirin-acetaminophen-caffeine (EXCEDRIN MIGRAINE) [phone] MG tablet Take 1 tablet by mouth every 6 (  six) hours as needed for migraine.    [provider]  atorvastatin (LIPITOR) 40 MG tablet Take 1 tablet (40 mg total) by mouth daily. 10/17/20   Breeback, Royetta Car, PA-C  meloxicam (MOBIC) 15 MG tablet Take 1 tablet (15 mg total) by mouth daily. 11/26/20   Breeback, Royetta Car, PA-C  sildenafil (VIAGRA) 100 MG tablet Take 1 tablet (100 mg total) by mouth as needed for erectile dysfunction (for use prior to sexual activity). 11/26/20   Breeback, Jade L, PA-C  vitamin B-12 (CYANOCOBALAMIN) 1000 MCG tablet Take 1,000 mcg by mouth daily.    [provider]     Family History Family History  Problem Relation Age of Onset  . Hyperlipidemia Father   . Hypertension Father   . Heart attack Father   . Valvular heart disease Father   . Other Father        somr part of intestines  removed- ? etiology   . Colon polyps Father   . Colon cancer Neg Hx   . Esophageal cancer Neg Hx   . Rectal cancer Neg Hx   . Stomach cancer Neg Hx     Social History Social History   Tobacco Use  . Smoking status: Never Smoker  . Smokeless tobacco: Never Used  Vaping Use  . Vaping Use: Never used  Substance Use Topics  . Alcohol use: Yes    Alcohol/week: 1.0 standard drink    Types: 1 Glasses of wine per week  . Drug use: No     Allergies   Iodinated diagnostic agents   Review of Systems Review of Systems  Constitutional: Negative.   HENT: Positive for congestion, ear pain, sinus pressure and sinus pain.   Eyes: Negative.   Respiratory: Negative.   Cardiovascular: Negative.   Gastrointestinal: Negative.   Genitourinary: Negative.   Musculoskeletal: Positive for neck pain.  Skin: Negative.   Neurological: Negative.      Physical Exam Triage Vital Signs ED Triage Vitals  Enc Vitals Group     BP 03/12/21 1028 134/79     Pulse Rate 03/12/21 1028 (!) 55     Resp 03/12/21 1028 18     Temp 03/12/21 1028 97.6 F (36.4 C)     Temp Source 03/12/21 1028 Oral     SpO2 03/12/21 1028 98 %     Weight --      Height --      Head Circumference --      Peak Flow --      Pain Score 03/12/21 1030 6     Pain Loc --      Pain Edu? --      Excl. in [place]? --    No data found.  Updated Vital Signs BP 134/79 (BP Location: Left Arm)   Pulse (!) 55   Temp 97.6 F (36.4 C) (Oral)   Resp 18   SpO2 98%     Physical Exam Vitals and nursing note reviewed.  Constitutional:      General: He is not in acute distress.    Appearance: Normal appearance. He is ill-appearing.  HENT:     Head: Normocephalic and atraumatic.     Right Ear: Hearing and  external ear normal. Tympanic membrane is retracted. Tympanic membrane has decreased mobility.     Left Ear: Hearing and external ear normal. Tympanic membrane is retracted. Tympanic membrane has decreased mobility.     Ears:     Comments: Bilateral ETD noted  Nose:     Right Turbinates: Enlarged.     Left Turbinates: Enlarged.     Right Sinus: Maxillary sinus tenderness present.     Left Sinus: Maxillary sinus tenderness present.     Comments: Turbinates are erythematous    Mouth/Throat:     Lips: Pink.     Mouth: Mucous membranes are moist.     Pharynx: Oropharynx is clear. Uvula midline. No pharyngeal swelling, oropharyngeal exudate, posterior oropharyngeal erythema or uvula swelling.  Neck:     Vascular: No carotid bruit.     Comments: LROM with 4 planes of movement Cardiovascular:     Rate and Rhythm: Normal rate and regular rhythm.     Pulses: Normal pulses.     Heart sounds: Normal heart sounds.  Pulmonary:     Effort: Pulmonary effort is normal.     Breath sounds: Normal breath sounds.     Comments: No adventitious breath sounds noted Musculoskeletal:     Cervical back: No tenderness.  Lymphadenopathy:     Cervical: No cervical adenopathy.  Skin:    General: Skin is warm and dry.  Neurological:     General: No focal deficit present.     Mental Status: He is alert and oriented to person, place, and time.     Cranial Nerves: No cranial nerve deficit.     Sensory: No sensory deficit.     Motor: No weakness.     Coordination: Coordination normal.     Gait: Gait normal.     Deep Tendon Reflexes: Reflexes normal.  Psychiatric:        Mood and Affect: Mood normal.        Behavior: Behavior normal.        Thought Content: Thought content normal.      UC Treatments / Results  Labs (all labs ordered are listed, but only abnormal results are displayed) Labs Reviewed - No data to display  EKG   Radiology No results found.  Procedures Procedures (including  critical care time)  Medications Ordered in UC Medications  methylPREDNISolone acetate (DEPO-MEDROL) injection 80 mg (80 mg Intramuscular Given 03/12/21 1203)    Initial Impression / Assessment and Plan / UC Course  I have reviewed the triage vital signs and the nursing notes.  Pertinent labs & imaging results that were available during my care of the patient were reviewed by me and considered in my medical decision making (see chart for details).     MDM: 1.  Cervical radiculopathy, 2.  Subacute maxillary sinusitis, 3.  Sinus pressure.  Patient discharged home, hemodynamically stable Final Clinical Impressions(s) / UC Diagnoses   Final diagnoses:  Cervical disc disorder with radiculopathy of cervical region  Subacute maxillary sinusitis  Sinus pressure     Discharge Instructions     Advised/instructed patient to start oral prednisone taper tomorrow, Thursday, 03/13/2021.  Advised patient to take medication with food to completion.  Instructed patient to start Augmentin today twice daily for the next 7 days, advised patient to take medication with food to completion; encourage patient to increase daily water intake while taking these medications.    ED Prescriptions    Medication Sig Dispense Auth. Provider   amoxicillin-clavulanate (AUGMENTIN) 875-125 MG tablet Take 1 tablet by mouth every 12 (twelve) hours. 14 tablet Eliezer Lofts, FNP   predniSONE (DELTASONE) 20 MG tablet Take 3 tabs PO daily x 3 days, then 2 tabs PO daily x 3 days, then 1 tab PO daily x 3  days 18 tablet Eliezer Lofts, FNP     PDMP not reviewed this encounter.   Eliezer Lofts, Benjamin Perez 03/12/21 1214

## 2021-03-12 NOTE — ED Triage Notes (Addendum)
t c/o RT sided facial pain x 6 weeks. Pain is intermittent. Also says pain radiates down neck. Says it mostly happens when hes sitting or lying down. Says massages sometimes resolve it.  Pain 6/10 Can not get shingles shot.

## 2021-03-13 DIAGNOSIS — R001 Bradycardia, unspecified: Secondary | ICD-10-CM | POA: Diagnosis not present

## 2021-03-13 DIAGNOSIS — M6281 Muscle weakness (generalized): Secondary | ICD-10-CM | POA: Diagnosis not present

## 2021-03-13 DIAGNOSIS — M25612 Stiffness of left shoulder, not elsewhere classified: Secondary | ICD-10-CM | POA: Diagnosis not present

## 2021-03-13 DIAGNOSIS — M25512 Pain in left shoulder: Secondary | ICD-10-CM | POA: Diagnosis not present

## 2021-03-18 DIAGNOSIS — M25612 Stiffness of left shoulder, not elsewhere classified: Secondary | ICD-10-CM | POA: Diagnosis not present

## 2021-03-18 DIAGNOSIS — M6281 Muscle weakness (generalized): Secondary | ICD-10-CM | POA: Diagnosis not present

## 2021-03-18 DIAGNOSIS — M25512 Pain in left shoulder: Secondary | ICD-10-CM | POA: Diagnosis not present

## 2021-03-20 DIAGNOSIS — M25612 Stiffness of left shoulder, not elsewhere classified: Secondary | ICD-10-CM | POA: Diagnosis not present

## 2021-03-20 DIAGNOSIS — M25512 Pain in left shoulder: Secondary | ICD-10-CM | POA: Diagnosis not present

## 2021-03-20 DIAGNOSIS — M6281 Muscle weakness (generalized): Secondary | ICD-10-CM | POA: Diagnosis not present

## 2021-03-26 DIAGNOSIS — M25612 Stiffness of left shoulder, not elsewhere classified: Secondary | ICD-10-CM | POA: Diagnosis not present

## 2021-03-26 DIAGNOSIS — M6281 Muscle weakness (generalized): Secondary | ICD-10-CM | POA: Diagnosis not present

## 2021-03-26 DIAGNOSIS — M25512 Pain in left shoulder: Secondary | ICD-10-CM | POA: Diagnosis not present

## 2021-04-03 ENCOUNTER — Other Ambulatory Visit: Payer: Self-pay

## 2021-04-03 ENCOUNTER — Ambulatory Visit (INDEPENDENT_AMBULATORY_CARE_PROVIDER_SITE_OTHER): Payer: Medicare HMO | Admitting: Family Medicine

## 2021-04-03 VITALS — BP 137/83 | HR 58

## 2021-04-03 DIAGNOSIS — M25612 Stiffness of left shoulder, not elsewhere classified: Secondary | ICD-10-CM | POA: Diagnosis not present

## 2021-04-03 DIAGNOSIS — D510 Vitamin B12 deficiency anemia due to intrinsic factor deficiency: Secondary | ICD-10-CM | POA: Diagnosis not present

## 2021-04-03 DIAGNOSIS — M25512 Pain in left shoulder: Secondary | ICD-10-CM | POA: Diagnosis not present

## 2021-04-03 DIAGNOSIS — M6281 Muscle weakness (generalized): Secondary | ICD-10-CM | POA: Diagnosis not present

## 2021-04-03 MED ORDER — CYANOCOBALAMIN 1000 MCG/ML IJ SOLN
1000.0000 ug | Freq: Once | INTRAMUSCULAR | Status: AC
  Administered 2021-04-03: 1000 ug via INTRAMUSCULAR

## 2021-04-03 NOTE — Progress Notes (Signed)
Established Patient Office Visit  Subjective:  Patient ID: Jeffery Anthony, male    DOB: 04-02-1951  Age: 70 y.o. MRN: [id number]  CC:  Chief Complaint  Patient presents with   Pernicious Anemia    HPI Jeffery Anthony is here for a vitamin B 12 injection. Denies muscle cramps, weakness or irregular heart rate. He is due for a B12 lab.  Past Medical History:  Diagnosis Date   Arthritis    knees   GERD (gastroesophageal reflux disease)    past hx    H/O Guillain-Barre syndrome    almost 30 yrs ago- ~ 1982 or so -- age 56 -- rx to flu vax    Headache    migraines   Hyperlipidemia    borderline- on Atorvastatin    Past Surgical History:  Procedure Laterality Date   COLONOSCOPY     KNEE ARTHROSCOPY Bilateral 1992, 2008   POLYPECTOMY     ruptured achilles tendon Left [place] ARTHROPLASTY Left 09/16/2018   Procedure: TOTAL KNEE ARTHROPLASTY;  Surgeon: Dorna Leitz, MD;  Location: [facility];  Service: Orthopedics;  Laterality: Left;    Family History  Problem Relation Age of Onset   Hyperlipidemia Father    Hypertension Father    Heart attack Father    Valvular heart disease Father    Other Father        somr part of intestines  removed- ? etiology    Colon polyps Father    Colon cancer Neg Hx    Esophageal cancer Neg Hx    Rectal cancer Neg Hx    Stomach cancer Neg Hx     Social History   Socioeconomic History   Marital status: Married    Spouse name: Not on file   Number of children: Not on file   Years of education: Not on file   Highest education level: Not on file  Occupational History   Not on file  Tobacco Use   Smoking status: Never   Smokeless tobacco: Never  Vaping Use   Vaping Use: Never used  Substance and Sexual Activity   Alcohol use: Yes    Alcohol/week: 1.0 standard drink    Types: 1 Glasses of wine per week   Drug use: No   Sexual activity: Yes  Other Topics Concern   Not on file   Social History Narrative   Not on file   Social Determinants of Health   Financial Resource Strain: Not on file  Food Insecurity: Not on file  Transportation Needs: Not on file  Physical Activity: Not on file  Stress: Not on file  Social Connections: Not on file  Intimate Partner Violence: Not on file    Outpatient Medications Prior to Visit  Medication Sig Dispense Refill   aspirin EC 81 MG tablet Take 81 mg by mouth daily.     aspirin-acetaminophen-caffeine (EXCEDRIN MIGRAINE) 250-250-65 MG tablet Take 1 tablet by mouth every 6 (six) hours as needed for migraine.     atorvastatin (LIPITOR) 40 MG tablet Take 1 tablet (40 mg total) by mouth daily. 90 tablet 3   meloxicam (MOBIC) 15 MG tablet Take 1 tablet (15 mg total) by mouth daily. 30 tablet 1   sildenafil (VIAGRA) 100 MG tablet Take 1 tablet (100 mg total) by mouth as needed for erectile dysfunction (for use prior to sexual activity). 10 tablet 2   vitamin B-12 (CYANOCOBALAMIN) 1000 MCG tablet Take 1,000  mcg by mouth daily.     amoxicillin-clavulanate (AUGMENTIN) 875-125 MG tablet Take 1 tablet by mouth every 12 (twelve) hours. 14 tablet 0   predniSONE (DELTASONE) 20 MG tablet Take 3 tabs PO daily x 3 days, then 2 tabs PO daily x 3 days, then 1 tab PO daily x 3 days 18 tablet 0   No facility-administered medications prior to visit.    Allergies  Allergen Reactions   Iodinated Diagnostic Agents Nausea And Vomiting and Other (See Comments)    iodine dye    ROS Review of Systems    Objective:    Physical Exam  BP 137/83   Pulse (!) 58   SpO2 97%  Wt Readings from Last 3 Encounters:  03/06/21 225 lb (102.1 kg)  02/04/21 225 lb (102.1 kg)  12/13/20 228 lb (103.4 kg)     Health Maintenance Due  Topic Date Due   Pneumococcal Vaccine 46-79 Years old (1 - PCV) Never done   Zoster Vaccines- Shingrix (1 of 2) Never done    There are no preventive care reminders to display for this patient.  Lab Results   Component Value Date   TSH 2.54 12/11/2019   Lab Results  Component Value Date   WBC 4.8 12/11/2019   HGB 14.1 12/11/2019   HCT 39.7 12/11/2019   MCV 116.8 (H) 12/11/2019   PLT 224 12/11/2019   Lab Results  Component Value Date   NA 141 08/01/2020   K 4.3 08/01/2020   CO2 25 08/01/2020   GLUCOSE 101 08/01/2020   BUN 19 08/01/2020   CREATININE 1.06 08/01/2020   BILITOT 0.6 08/01/2020   ALKPHOS 44 09/09/2018   AST 16 08/01/2020   ALT 12 08/01/2020   PROT 6.2 08/01/2020   ALBUMIN 4.4 09/09/2018   CALCIUM 9.1 08/01/2020   ANIONGAP 7 09/09/2018   Lab Results  Component Value Date   CHOL 181 08/01/2020   Lab Results  Component Value Date   HDL 46 08/01/2020   Lab Results  Component Value Date   LDLCALC 118 (H) 08/01/2020   Lab Results  Component Value Date   TRIG 77 08/01/2020   Lab Results  Component Value Date   CHOLHDL 3.9 08/01/2020   Lab Results  Component Value Date   HGBA1C 5.5 08/01/2020      Assessment & Plan:  Pernicious anemia - Patient tolerated injection well without complications. Patient advised to schedule next injection 30 days from today. Patient advised to go to the lab right before next injection. Orders have been placed.    Problem List Items Addressed This Visit     Pernicious anemia - Primary    Meds ordered this encounter  Medications   cyanocobalamin ((VITAMIN B-12)) injection 1,000 mcg    Follow-up: Return in about 4 weeks (around 05/01/2021) for B12 injection. [name], [name], [place]

## 2021-04-03 NOTE — Progress Notes (Signed)
Agree with documentation as above.   Valeen Borys, MD  

## 2021-04-15 DIAGNOSIS — M25612 Stiffness of left shoulder, not elsewhere classified: Secondary | ICD-10-CM | POA: Diagnosis not present

## 2021-04-15 DIAGNOSIS — M25512 Pain in left shoulder: Secondary | ICD-10-CM | POA: Diagnosis not present

## 2021-04-15 DIAGNOSIS — M6281 Muscle weakness (generalized): Secondary | ICD-10-CM | POA: Diagnosis not present

## 2021-04-17 ENCOUNTER — Other Ambulatory Visit: Payer: Self-pay

## 2021-04-17 ENCOUNTER — Ambulatory Visit (INDEPENDENT_AMBULATORY_CARE_PROVIDER_SITE_OTHER): Payer: Medicare HMO | Admitting: Sports Medicine

## 2021-04-17 DIAGNOSIS — M25512 Pain in left shoulder: Secondary | ICD-10-CM

## 2021-04-17 DIAGNOSIS — G8929 Other chronic pain: Secondary | ICD-10-CM

## 2021-04-17 DIAGNOSIS — Z98818 Other dental procedure status: Secondary | ICD-10-CM

## 2021-04-17 MED ORDER — HYDROCODONE-ACETAMINOPHEN 5-325 MG PO TABS: 1.0000 | ORAL_TABLET | Freq: Three times a day (TID) | ORAL | 0 refills | Status: DC | PRN

## 2021-04-17 NOTE — Progress Notes (Signed)
    Procedures performed today:    None.  Independent interpretation of notes and tests performed by another provider:   None.  Brief History, Exam, Impression, and Recommendations:    Chronic left shoulder pain Jeffery Anthony is a very pleasant 70 year old male, chronic left shoulder pain, initially impingement in nature. His subacromial injection only provided minimal improvement, we did a glenohumeral injection back in April and he had much better improvement, recently he went to catch his grandson who was going to fall, and this increased his pain, pain is predominantly impingement, he does have weakness to abduction concerning for rotator cuff tearing. He also has pain with abduction and external rotation concerning for labral injury. At this point he has failed many months of conservative treatment including therapy, steroid injections, NSAIDs, activity modification, x-rays have been done, we are going to proceed with MR arthrography on the left, hopefully we can schedule this on Monday and I can do the injection on Monday. Adding some hydrocodone to hold him over in the meantime.    ___________________________________________ Gwen Her. Dianah Field, M.D., ABFM., CAQSM. Primary Care and [place] Instructor of [place] of [facility] of [employer]

## 2021-04-17 NOTE — Assessment & Plan Note (Signed)
Jeffery Anthony is a very pleasant 70 year old male, chronic left shoulder pain, initially impingement in nature. His subacromial injection only provided minimal improvement, we did a glenohumeral injection back in April and he had much better improvement, recently he went to catch his grandson who was going to fall, and this increased his pain, pain is predominantly impingement, he does have weakness to abduction concerning for rotator cuff tearing. He also has pain with abduction and external rotation concerning for labral injury. At this point he has failed many months of conservative treatment including therapy, steroid injections, NSAIDs, activity modification, x-rays have been done, we are going to proceed with MR arthrography on the left, hopefully we can schedule this on Monday and I can do the injection on Monday. Adding some hydrocodone to hold him over in the meantime.

## 2021-04-18 MED ORDER — AMOXICILLIN 500 MG PO CAPS: 2000.0000 mg | ORAL_CAPSULE | Freq: Once | ORAL | 0 refills | Status: AC

## 2021-04-18 NOTE — Telephone Encounter (Signed)
I think Lovena Le is covering Jade's medical patients today.

## 2021-05-05 ENCOUNTER — Other Ambulatory Visit: Payer: Self-pay

## 2021-05-05 ENCOUNTER — Ambulatory Visit (INDEPENDENT_AMBULATORY_CARE_PROVIDER_SITE_OTHER): Payer: Medicare HMO

## 2021-05-05 ENCOUNTER — Ambulatory Visit: Payer: Medicare HMO

## 2021-05-05 ENCOUNTER — Encounter: Payer: Self-pay | Admitting: Sports Medicine

## 2021-05-05 ENCOUNTER — Ambulatory Visit (INDEPENDENT_AMBULATORY_CARE_PROVIDER_SITE_OTHER): Payer: Medicare HMO | Admitting: Sports Medicine

## 2021-05-05 DIAGNOSIS — M25512 Pain in left shoulder: Secondary | ICD-10-CM | POA: Diagnosis not present

## 2021-05-05 DIAGNOSIS — G8929 Other chronic pain: Secondary | ICD-10-CM

## 2021-05-05 DIAGNOSIS — S46012A Strain of muscle(s) and tendon(s) of the rotator cuff of left shoulder, initial encounter: Secondary | ICD-10-CM | POA: Diagnosis not present

## 2021-05-05 DIAGNOSIS — S46012D Strain of muscle(s) and tendon(s) of the rotator cuff of left shoulder, subsequent encounter: Secondary | ICD-10-CM

## 2021-05-05 NOTE — Progress Notes (Addendum)
    Procedures performed today:    Procedure: Real-time Ultrasound Guided gadolinium contrast injection of left glenohumeral joint Device: Samsung [id number]  Verbal informed consent obtained.  Time-out conducted.  Noted no overlying erythema, induration, or other signs of local infection.  Skin prepped in a sterile fashion.  Local anesthesia: Topical Ethyl chloride.  With sterile technique and under real time ultrasound guidance: Noted normal-appearing joint, 1 cc kenalog 40, 2 cc lidocaine, 2 cc bupivacaine injected easily, syringe switched and 0.1 cc gadolinium injected, syringe again switched and 10 cc sterile saline used to distend the joint. Joint visualized and capsule seen distending confirming intra-articular placement of contrast material and medication. Completed without difficulty  Advised to call if fevers/chills, erythema, induration, drainage, or persistent bleeding.  Images permanently stored in PACS Impression: Technically successful ultrasound guided gadolinium contrast injection for MR arthrography.  Please see separate MR arthrogram report.  Independent interpretation of notes and tests performed by another provider:   None.  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 He did not respond well to a subacromial injection, a glenohumeral injection did provide much better improvement, more recently he went to catch his grandson who was going to fall and this worsened his shoulder pain, he did have significant weakness to abduction concerning for rotator cuff tear. Also had pain with abduction and external rotation concerning for labral injury, MR arthrogram injection today. Return to see me to go over results.  Update, MRI does confirm complete rotator cuff tear, patient prefers to stay with [facility] so  we will refer to Dr. Tamera Punt.    ___________________________________________ Jeffery Anthony, M.D., ABFM., CAQSM. Primary Care and [place] Instructor of [place] of [employer] [facility] [employer]

## 2021-05-05 NOTE — Assessment & Plan Note (Addendum)
Jeffery Anthony is a very pleasant 70 year old male with chronic left shoulder pain, initially impingement in nature. He did not respond well to a subacromial injection, a glenohumeral injection did provide much better improvement, more recently he went to catch his grandson who was going to fall and this worsened his shoulder pain, he did have significant weakness to abduction concerning for rotator cuff tear. Also had pain with abduction and external rotation concerning for labral injury, MR arthrogram injection today. Return to see me to go over results.  Update, MRI does confirm complete rotator cuff tear, patient prefers to stay with [facility] so we will refer to Dr. Tamera Punt.

## 2021-05-07 ENCOUNTER — Ambulatory Visit (INDEPENDENT_AMBULATORY_CARE_PROVIDER_SITE_OTHER): Payer: Medicare HMO | Admitting: Physician Assistant

## 2021-05-07 DIAGNOSIS — Z0000 Encounter for general adult medical examination without abnormal findings: Secondary | ICD-10-CM | POA: Diagnosis not present

## 2021-05-07 NOTE — Progress Notes (Signed)
MEDICARE ANNUAL WELLNESS VISIT  05/07/2021  Telephone Visit Disclaimer This Medicare AWV was conducted by telephone due to national recommendations for restrictions regarding the COVID-19 Pandemic (e.g. social distancing).  I verified, using two identifiers, that I am speaking with Jeffery Anthony or their authorized healthcare agent. I discussed the limitations, risks, security, and privacy concerns of performing an evaluation and management service by telephone and the potential availability of an in-person appointment in the future. The patient expressed understanding and agreed to proceed.  Location of Patient: Home Location of Provider (nurse):  In the office.  Subjective:    Jeffery Anthony is a 70 y.o. male patient of Alden Hipp, Royetta Car, PA-C who had a Medicare Annual Wellness Visit today via telephone. [name] is Retired and lives with their spouse. he has 5 children. he reports that he is socially active and does interact with friends/family regularly. he is moderately physically active and enjoys yard work, exercising and golfing.  Patient Care Team: Lavada Mesi as PCP - General (Family Medicine)  Advanced Directives 05/07/2021 09/16/2018 09/09/2018  Does Patient Have a Medical Advance Directive? No No No  Would patient like information on creating a medical advance directive? No - Patient declined No - Patient declined No - Patient declined    Hospital Utilization Over the Past 12 Months: # of hospitalizations or ER visits: 1 # of surgeries: 2 (dental surgeries)  Review of Systems    Patient reports that his overall health is better compared to last year.  History obtained from chart review and the patient  Patient Reported Readings (BP, Pulse, CBG, Weight, etc) none  Pain Assessment Pain : No/denies pain     Current Medications & Allergies (verified) Allergies as of 05/07/2021       Reactions   Iodinated Diagnostic Agents Nausea And Vomiting, Other  (See Comments)   iodine dye        Medication List        Accurate as of May 07, 2021  3:19 PM. If you have any questions, ask your nurse or doctor.          aspirin EC 81 MG tablet Take 81 mg by mouth daily.   aspirin-acetaminophen-caffeine 250-250-65 MG tablet Commonly known as: EXCEDRIN MIGRAINE Take 1 tablet by mouth every 6 (six) hours as needed for migraine.   atorvastatin 40 MG tablet Commonly known as: LIPITOR Take 1 tablet (40 mg total) by mouth daily.   HYDROcodone-acetaminophen 5-325 MG tablet Commonly known as: NORCO/VICODIN Take 1 tablet by mouth every 8 (eight) hours as needed for moderate pain.   meloxicam 15 MG tablet Commonly known as: MOBIC Take 1 tablet (15 mg total) by mouth daily.   sildenafil 100 MG tablet Commonly known as: VIAGRA Take 1 tablet (100 mg total) by mouth as needed for erectile dysfunction (for use prior to sexual activity).   vitamin B-12 1000 MCG tablet Commonly known as: CYANOCOBALAMIN Take 1,000 mcg by mouth daily.        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 etiology     Colon polyps Father    Colon cancer Neg Hx    Esophageal cancer Neg Hx    Rectal cancer Neg Hx    Stomach cancer Neg Hx    Social History   Socioeconomic History   Marital status: Married    Spouse name: JoAnne   Number of children: 5   Years of education: 18   Highest education level: Master's degree (e.g., MA, MS, MEng, MEd, MSW, MBA)  Occupational History    Comment: Retired  Tobacco Use   Smoking status: Never   Smokeless tobacco: Never  Vaping Use   Vaping Use: Never used  Substance and Sexual Activity   Alcohol use: Yes    Alcohol/week: 1.0 standard drink    Types: 1 Glasses of wine per week   Drug use: No   Sexual activity: Yes  Other Topics Concern   Not on file  Social History Narrative   He lives with wife. He enjoys doing yard work, Careers information officer and exercising.   Social Determinants of Health   Financial Resource Strain: Low Risk    Difficulty of Paying Living Expenses: Not hard at all  Food Insecurity: No Food Insecurity   Worried About Charity fundraiser in the Last Year: Never true   [place] in the Last Year: Never true  Transportation Needs: No Transportation Needs   Lack of Transportation (Medical): No   Lack of Transportation (Non-Medical): No  Physical Activity: Sufficiently Active   Days of Exercise per Week: 5 days   Minutes of Exercise per Session: 90 min  Stress: No Stress Concern Present   Feeling of Stress : Not at all  Social Connections: Moderately Isolated   Frequency of Communication with Friends and Family: More than three times a week   Frequency of Social Gatherings with Friends and Family: Twice a week   Attends Religious Services: Never   Marine scientist or Organizations: No   Attends Archivist Meetings: Never   Marital Status: Married    Activities of Daily Living In your present state of health, do you have any difficulty performing the following activities: 05/07/2021 02/04/2021  Hearing? N N  Vision? N  N  Difficulty concentrating or making decisions? N N  Walking or climbing stairs? Y N  Comment at times, due to knee pain. -  Dressing or bathing? N N  Doing errands, shopping? N N  Preparing Food and eating ? N -  Using the Toilet? N -  In the past six months, have you accidently leaked urine? N -  Do you have problems with loss of bowel control? N -  Managing your Medications? N -  Managing your Finances? N -  Housekeeping or managing your Housekeeping? N -  Some recent data might be hidden    Patient Education/ Literacy How often do you  need to have someone help you when you read instructions, pamphlets, or other written materials from your doctor or pharmacy?: 1 - Never What is the last grade level you completed in school?: Masters Degree  Exercise Current Exercise Habits: Home exercise routine;Structured exercise class, Type of exercise: strength training/weights;stretching;treadmill;walking, Time (Minutes): > 60, Frequency (Times/Week): 7, Weekly Exercise (Minutes/Week): 0, Intensity: Moderate, Exercise limited by: None identified  Diet Patient reports consuming  2-3  meals a day and 0-1 snack(s) a day Patient reports that his primary diet is: Regular Patient reports that she does have regular access to food.   Depression Screen PHQ 2/9 Scores 05/07/2021 03/06/2021 07/16/2020 03/11/2020 07/07/2017  PHQ - 2 Score 0 0 0 0 0  PHQ- 9 Score - - 0 0 -     Fall Risk Fall Risk  05/07/2021 03/06/2021  Falls in the past year? 1 0  Number falls in past yr: 0 0  Injury with Fall? 1 0  Risk for fall due to : No Fall Risks -  Follow up Falls evaluation completed Falls evaluation completed     Objective:  Jeffery Anthony seemed alert and oriented and he participated appropriately during our telephone visit.  Blood Pressure Weight BMI  BP Readings from Last 3 Encounters:  04/03/21 137/83  03/12/21 134/79  03/06/21 117/72   Wt Readings from Last 3 Encounters:  03/06/21 225 lb (102.1  kg)  02/04/21 225 lb (102.1 kg)  12/13/20 228 lb (103.4 kg)   BMI Readings from Last 1 Encounters:  03/06/21 31.38 kg/m    *Unable to obtain current vital signs, weight, and BMI due to telephone visit type  Hearing/Vision  Gwyndolyn Saxon did not seem to have difficulty with hearing/understanding during the telephone conversation Reports that he has had a formal eye exam by an eye care professional within the past year Reports that he has not had a formal hearing evaluation within the past year *Unable to fully assess hearing and vision during telephone visit type  Cognitive Function: 6CIT Screen 05/07/2021  What Year? 0 points  What month? 0 points  What time? 0 points  Count back from 20 0 points  Months in reverse 0 points  Repeat phrase 0 points  Total Score 0   (Normal:0-7, Significant for Dysfunction: >8)  Normal Cognitive Function Screening: Yes   Immunization & Health Maintenance Record Immunization History  Administered Date(s) Administered   Tdap 07/01/2014    Health Maintenance  Topic Date Due   Zoster Vaccines- Shingrix (1 of 2) Never done   COLONOSCOPY (Pts 45-50yrs Insurance coverage will need to be confirmed)  12/13/2021   Hepatitis C Screening  Completed   HPV VACCINES  Aged Out   INFLUENZA VACCINE  Discontinued   TETANUS/TDAP  Discontinued   COVID-19 Vaccine  Discontinued   PNA vac Low Risk Adult  Discontinued       Assessment  This is a routine wellness examination for Jeffery Anthony.  Health Maintenance: Due or Overdue Health Maintenance Due  Topic Date Due   Zoster Vaccines- Shingrix (1 of 2) Never done    Jeffery Anthony does not need a referral for Community Assistance: Care Management:   no Social Work:    no Prescription Assistance:  no Nutrition/Diabetes Education:  no   Plan:  Personalized Goals  Goals Addressed               This Visit's Progress     Patient Stated (pt-stated)  05/07/2021 AWV Goal: Exercise for  General Health  Patient will verbalize understanding of the benefits of increased physical activity: Exercising regularly is important. It will improve your overall fitness, flexibility, and endurance. Regular exercise also will improve your overall health. It can help you control your weight, reduce stress, and improve your bone density. Over the next year, patient will increase physical activity as tolerated with a goal of at least 150 minutes of moderate physical activity per week.  You can tell that you are exercising at a moderate intensity if your heart starts beating faster and you start breathing faster but can still hold a conversation. Moderate-intensity exercise ideas include: Walking 1 mile (1.6 km) in about 15 minutes Biking Hiking Golfing Dancing Water aerobics Patient will verbalize understanding of everyday activities that increase physical activity by providing examples like the following: Yard work, such as: Sales promotion account executive Gardening Washing windows or floors Patient will be able to explain general safety guidelines for exercising:  Before you start a new exercise program, talk with your health care provider. Do not exercise so much that you hurt yourself, feel dizzy, or get very short of breath. Wear comfortable clothes and wear shoes with good support. Drink plenty of water while you exercise to prevent dehydration or heat stroke. Work out until your breathing and your heartbeat get faster.         Personalized Health Maintenance & Screening Recommendations  Colorectal cancer screening - due in February, 2023.  Lung Cancer Screening Recommended: no (Low Dose CT Chest recommended if Age 79-80 years, 30 pack-year currently smoking OR have quit w/in past 15 years) Hepatitis C Screening recommended: no HIV Screening recommended: no  Advanced Directives: Written information was  not prepared per patient's request.  Referrals & Orders No orders of the defined types were placed in this encounter.   Follow-up Plan Follow-up with Donella Stade, PA-C as planned Medicare wellness visit in one year. Patient stated that he will access his AVS on mychart.   I have personally reviewed and noted the following in the patient's chart:   Medical and social history Use of alcohol, tobacco or illicit drugs  Current medications and supplements Functional ability and status Nutritional status Physical activity Advanced directives List of other physicians Hospitalizations, surgeries, and ER visits in previous 12 months Vitals Screenings to include cognitive, depression, and falls Referrals and appointments  In addition, I have reviewed and discussed with Jeffery Anthony certain preventive protocols, quality metrics, and best practice recommendations. A written personalized care plan for preventive services as well as general preventive health recommendations is available and can be mailed to the patient at his request.      Tinnie Gens, RN  05/07/2021

## 2021-05-07 NOTE — Patient Instructions (Addendum)
[place] Maintenance Summary and Written Plan of Care  [name] ,  Thank you for allowing me to perform your Medicare Annual Wellness Visit and for your ongoing commitment to your health.   Health Maintenance & Immunization History Health Maintenance  Topic Date Due   Zoster Vaccines- Shingrix (1 of 2) 08/07/2021 (Originally 07/28/1970)   COLONOSCOPY (Pts 45-28yrs Insurance coverage will need to be confirmed)  12/13/2021   Hepatitis C Screening  Completed   HPV VACCINES  Aged Out   INFLUENZA VACCINE  Discontinued   TETANUS/TDAP  Discontinued   COVID-19 Vaccine  Discontinued   PNA vac Low Risk Adult  Discontinued   Immunization History  Administered Date(s) Administered   Tdap 07/01/2014    These are the patient goals that we discussed:  Goals Addressed               This Visit's Progress     Patient Stated (pt-stated)        05/07/2021 AWV Goal: Exercise for General Health  Patient will verbalize understanding of the benefits of increased physical activity: Exercising regularly is important. It will improve your overall fitness, flexibility, and endurance. Regular exercise also will improve your overall health. It can help you control your weight, reduce stress, and improve your bone density. Over the next year, patient will increase physical activity as tolerated with a goal of at least 150 minutes of moderate physical activity per week.  You can tell that you are exercising at a moderate intensity if your heart starts beating faster and you start breathing faster but can still hold a conversation. Moderate-intensity exercise ideas include: Walking 1 mile (1.6 km) in about 15 minutes Biking Hiking Golfing Dancing Water aerobics Patient will verbalize understanding of everyday activities that increase physical activity by providing examples like the following: Yard work, such as: Geophysical data processor Gardening Washing windows or floors Patient will be able to explain general safety guidelines for exercising:  Before you start a new exercise program, talk with your health care provider. Do not exercise so much that you hurt yourself, feel dizzy, or get very short of breath. Wear comfortable clothes and wear shoes with good support. Drink plenty of water while you exercise to prevent dehydration or heat stroke. Work out until your breathing and your heartbeat get faster.           This is a list of Health Maintenance Items that are overdue or due now: Colorectal cancer screening - due in February, 2023.  Orders/Referrals Placed Today: No orders of the defined types were placed in this encounter.  (Contact our referral department at [phone] if you have not spoken with someone about your referral appointment within the next 5 days)    Follow-up Plan Follow-up with Donella Stade, PA-C as planned Medicare wellness visit in one year. Patient stated that he will access his AVS on mychart.     Health Maintenance, Male Adopting a healthy lifestyle and getting preventive care are important in promoting health and wellness. Ask your health care provider about: The right schedule for you to have regular tests and exams. Things you can do on your own to prevent diseases and keep yourself healthy. What should I know about diet, weight, and exercise? Eat a healthy diet  Eat a diet that includes plenty of vegetables, fruits, low-fat dairy products, and lean protein. Do not eat  a lot of foods that are high in solid fats, added sugars, or sodium.  Maintain a healthy weight Body mass index (BMI) is a measurement that can be used to identify possible weight problems. It estimates body fat based on height and weight. Your health care provider can help determine your BMI and help you achieve or maintain ahealthy weight. Get  regular exercise Get regular exercise. This is one of the most important things you can do for your health. Most adults should: Exercise for at least 150 minutes each week. The exercise should increase your heart rate and make you sweat (moderate-intensity exercise). Do strengthening exercises at least twice a week. This is in addition to the moderate-intensity exercise. Spend less time sitting. Even light physical activity can be beneficial. Watch cholesterol and blood lipids Have your blood tested for lipids and cholesterol at 69 years of age, then havethis test every 5 years. You may need to have your cholesterol levels checked more often if: Your lipid or cholesterol levels are high. You are older than 69 years of age. You are at high risk for heart disease. What should I know about cancer screening? Many types of cancers can be detected early and may often be prevented. Depending on your health history and family history, you may need to have cancer screening at various ages. This may include screening for: Colorectal cancer. Prostate cancer. Skin cancer. Lung cancer. What should I know about heart disease, diabetes, and high blood pressure? Blood pressure and heart disease High blood pressure causes heart disease and increases the risk of stroke. This is more likely to develop in people who have high blood pressure readings, are of African descent, or are overweight. Talk with your health care provider about your target blood pressure readings. Have your blood pressure checked: Every 3-5 years if you are 73-81 years of age. Every year if you are 75 years old or older. If you are between the ages of 71 and 26 and are a current or former smoker, ask your health care provider if you should have a one-time screening for abdominal aortic aneurysm (AAA). Diabetes Have regular diabetes screenings. This checks your fasting blood sugar level. Have the screening done: Once every three years  after age 59 if you are at a normal weight and have a low risk for diabetes. More often and at a younger age if you are overweight or have a high risk for diabetes. What should I know about preventing infection? Hepatitis B If you have a higher risk for hepatitis B, you should be screened for this virus. Talk with your health care provider to find out if you are at risk forhepatitis B infection. Hepatitis C Blood testing is recommended for: Everyone born from [birth year] through 1964. Anyone with known risk factors for hepatitis C. Sexually transmitted infections (STIs) You should be screened each year for STIs, including gonorrhea and chlamydia, if: You are sexually active and are younger than 69 years of age. You are older than 69 years of age and your health care provider tells you that you are at risk for this type of infection. Your sexual activity has changed since you were last screened, and you are at increased risk for chlamydia or gonorrhea. Ask your health care provider if you are at risk. Ask your health care provider about whether you are at high risk for HIV. Your health care provider may recommend a prescription medicine to help prevent HIV infection. If you choose to take  medicine to prevent HIV, you should first get tested for HIV. You should then be tested every 3 months for as long as you are taking the medicine. Follow these instructions at home: Lifestyle Do not use any products that contain nicotine or tobacco, such as cigarettes, e-cigarettes, and chewing tobacco. If you need help quitting, ask your health care provider. Do not use street drugs. Do not share needles. Ask your health care provider for help if you need support or information about quitting drugs. Alcohol use Do not drink alcohol if your health care provider tells you not to drink. If you drink alcohol: Limit how much you have to 0-2 drinks a day. Be aware of how much alcohol is in your drink. In the [place] one  drink equals one 12 oz bottle of beer (355 mL), one 5 oz glass of wine (148 mL), or one 1 oz glass of hard liquor (44 mL). General instructions Schedule regular health, dental, and eye exams. Stay current with your vaccines. Tell your health care provider if: You often feel depressed. You have ever been abused or do not feel safe at home. Summary Adopting a healthy lifestyle and getting preventive care are important in promoting health and wellness. Follow your health care provider's instructions about healthy diet, exercising, and getting tested or screened for diseases. Follow your health care provider's instructions on monitoring your cholesterol and blood pressure. This information is not intended to replace advice given to you by your health care provider. Make sure you discuss any questions you have with your healthcare provider. Document Revised: 10/05/2018 Document Reviewed: 10/05/2018 Elsevier Patient Education  2022 [employer].

## 2021-05-08 ENCOUNTER — Other Ambulatory Visit: Payer: Self-pay

## 2021-05-08 ENCOUNTER — Ambulatory Visit (INDEPENDENT_AMBULATORY_CARE_PROVIDER_SITE_OTHER): Payer: Medicare HMO | Admitting: Family Medicine

## 2021-05-08 VITALS — BP 131/71 | HR 87 | Resp 20

## 2021-05-08 DIAGNOSIS — D510 Vitamin B12 deficiency anemia due to intrinsic factor deficiency: Secondary | ICD-10-CM

## 2021-05-08 DIAGNOSIS — E538 Deficiency of other specified B group vitamins: Secondary | ICD-10-CM | POA: Diagnosis not present

## 2021-05-08 MED ORDER — CYANOCOBALAMIN 1000 MCG/ML IJ SOLN
1000.0000 ug | Freq: Once | INTRAMUSCULAR | Status: AC
Start: 2021-05-08 — End: 2021-05-08
  Administered 2021-05-08: 1000 ug via INTRAMUSCULAR

## 2021-05-08 NOTE — Progress Notes (Signed)
Pt had B12 lab drawn today prior to injection.

## 2021-05-08 NOTE — Progress Notes (Signed)
Agree with documentation as above.   Jeffery Mallinger, MD  

## 2021-05-08 NOTE — Progress Notes (Signed)
Established Patient Office Visit  Subjective:  Patient ID: Jeffery Anthony, male    DOB: 05/10/1951  Age: 70 y.o. MRN: [id number]  CC:  Chief Complaint  Patient presents with   Pernicious Anemia    HPI Rommie Dunn presents for a B12 injection. Denies muscle cramps, weakness or irregular heart rate. Injection given in left deltoid, pt tolerated well with no apparent complications. Pt had B12 lab drawn today prior to injection.  Past Medical History:  Diagnosis Date   Arthritis    knees   GERD (gastroesophageal reflux disease)    past hx    H/O Guillain-Barre syndrome    almost 30 yrs ago- ~ 1982 or so -- age 79 -- rx to flu vax    Headache    migraines   Hyperlipidemia    borderline- on Atorvastatin    Past Surgical History:  Procedure Laterality Date   COLONOSCOPY     KNEE ARTHROSCOPY Bilateral 1992, 2008   POLYPECTOMY     ruptured achilles tendon Left [place] ARTHROPLASTY Left 09/16/2018   Procedure: TOTAL KNEE ARTHROPLASTY;  Surgeon: Dorna Leitz, MD;  Location: [facility];  Service: Orthopedics;  Laterality: Left;    Family History  Problem Relation Age of Onset   Hyperlipidemia Father    Hypertension Father    Heart attack Father    Valvular heart disease Father    Other Father        somr part of intestines  removed- ? etiology    Colon polyps Father    Colon cancer Neg Hx    Esophageal cancer Neg Hx    Rectal cancer Neg Hx    Stomach cancer Neg Hx     Social History   Socioeconomic History   Marital status: Married    Spouse name: JoAnne   Number of children: 5   Years of education: 18   Highest education level: Master's degree (e.g., MA, MS, MEng, MEd, MSW, MBA)  Occupational History    Comment: Retired  Tobacco Use   Smoking status: Never   Smokeless tobacco: Never  Vaping Use   Vaping Use: Never used  Substance and Sexual Activity   Alcohol use: Yes    Alcohol/week: 1.0 standard drink     Types: 1 Glasses of wine per week   Drug use: No   Sexual activity: Yes  Other Topics Concern   Not on file  Social History Narrative   He lives with wife. He enjoys doing yard work, Careers information officer and exercising.   Social Determinants of Health   Financial Resource Strain: Low Risk    Difficulty of Paying Living Expenses: Not hard at all  Food Insecurity: No Food Insecurity   Worried About Charity fundraiser in the Last Year: Never true   [place] in the Last Year: Never true  Transportation Needs: No Transportation Needs   Lack of Transportation (Medical): No   Lack of Transportation (Non-Medical): No  Physical Activity: Sufficiently Active   Days of Exercise per Week: 5 days   Minutes of Exercise per Session: 90 min  Stress: No Stress Concern Present   Feeling of Stress : Not at all  Social Connections: Moderately Isolated   Frequency of Communication with Friends and Family: More than three times a week   Frequency of Social Gatherings with Friends and Family: Twice a week   Attends Religious Services: Never   Active Member  of Clubs or Organizations: No   Attends Archivist Meetings: Never   Marital Status: Married  Human resources officer Violence: Not At Risk   Fear of Current or Ex-Partner: No   Emotionally Abused: No   Physically Abused: No   Sexually Abused: No    Outpatient Medications Prior to Visit  Medication Sig Dispense Refill   aspirin EC 81 MG tablet Take 81 mg by mouth daily.     aspirin-acetaminophen-caffeine (EXCEDRIN MIGRAINE) 250-250-65 MG tablet Take 1 tablet by mouth every 6 (six) hours as needed for migraine.     atorvastatin (LIPITOR) 40 MG tablet Take 1 tablet (40 mg total) by mouth daily. 90 tablet 3   meloxicam (MOBIC) 15 MG tablet Take 1 tablet (15 mg total) by mouth daily. 30 tablet 1   sildenafil (VIAGRA) 100 MG tablet Take 1 tablet (100 mg total) by mouth as needed for erectile dysfunction (for use prior to sexual activity). 10  tablet 2   vitamin B-12 (CYANOCOBALAMIN) 1000 MCG tablet Take 1,000 mcg by mouth daily.     HYDROcodone-acetaminophen (NORCO/VICODIN) 5-325 MG tablet Take 1 tablet by mouth every 8 (eight) hours as needed for moderate pain. (Patient not taking: Reported on 05/05/2021) 15 tablet 0   No facility-administered medications prior to visit.    Allergies  Allergen Reactions   Iodinated Diagnostic Agents Nausea And Vomiting and Other (See Comments)    iodine dye    ROS Review of Systems    Objective:    Physical Exam  BP 131/71 (BP Location: Left Arm, Patient Position: Sitting, Cuff Size: Large)   Pulse 87   Resp 20   SpO2 98%  Wt Readings from Last 3 Encounters:  03/06/21 225 lb (102.1 kg)  02/04/21 225 lb (102.1 kg)  12/13/20 228 lb (103.4 kg)     There are no preventive care reminders to display for this patient.  There are no preventive care reminders to display for this patient.  Lab Results  Component Value Date   TSH 2.54 12/11/2019   Lab Results  Component Value Date   WBC 4.8 12/11/2019   HGB 14.1 12/11/2019   HCT 39.7 12/11/2019   MCV 116.8 (H) 12/11/2019   PLT 224 12/11/2019   Lab Results  Component Value Date   NA 141 08/01/2020   K 4.3 08/01/2020   CO2 25 08/01/2020   GLUCOSE 101 08/01/2020   BUN 19 08/01/2020   CREATININE 1.06 08/01/2020   BILITOT 0.6 08/01/2020   ALKPHOS 44 09/09/2018   AST 16 08/01/2020   ALT 12 08/01/2020   PROT 6.2 08/01/2020   ALBUMIN 4.4 09/09/2018   CALCIUM 9.1 08/01/2020   ANIONGAP 7 09/09/2018   Lab Results  Component Value Date   CHOL 181 08/01/2020   Lab Results  Component Value Date   HDL 46 08/01/2020   Lab Results  Component Value Date   LDLCALC 118 (H) 08/01/2020   Lab Results  Component Value Date   TRIG 77 08/01/2020   Lab Results  Component Value Date   CHOLHDL 3.9 08/01/2020   Lab Results  Component Value Date   HGBA1C 5.5 08/01/2020      Assessment & Plan:  Injection given in left  deltoid, pt tolerated well with no apparent complications.  Problem List Items Addressed This Visit       Other   Pernicious anemia - Primary    Meds ordered this encounter  Medications   cyanocobalamin ((VITAMIN B-12)) injection 1,000 mcg  Follow-up: Return in about 1 month (around 06/08/2021) for B12 Injection.    [name], CMA

## 2021-05-09 ENCOUNTER — Other Ambulatory Visit: Payer: Self-pay | Admitting: *Deleted

## 2021-05-09 DIAGNOSIS — E538 Deficiency of other specified B group vitamins: Secondary | ICD-10-CM

## 2021-05-09 LAB — VITAMIN B12: Vitamin B-12: >2000 pg/mL — ABNORMAL HIGH (ref 200–1100)

## 2021-05-12 NOTE — Addendum Note (Signed)
Addended by: Silverio Decamp on: 05/12/2021 06:34 PM   Modules accepted: Orders

## 2021-05-23 DIAGNOSIS — M75122 Complete rotator cuff tear or rupture of left shoulder, not specified as traumatic: Secondary | ICD-10-CM | POA: Diagnosis not present

## 2021-05-27 DIAGNOSIS — M1711 Unilateral primary osteoarthritis, right knee: Secondary | ICD-10-CM | POA: Diagnosis not present

## 2021-06-09 ENCOUNTER — Ambulatory Visit: Payer: Medicare HMO

## 2021-07-02 ENCOUNTER — Telehealth: Payer: Self-pay | Admitting: Lab

## 2021-07-02 NOTE — Progress Notes (Signed)
  Chronic Care Management   Outreach Note  07/02/2021 Name: Jeffery Anthony MRN: [id number] DOB: 11-24-1950  Referred by: Donella Stade, PA-C Reason for referral : Medication Management   An unsuccessful telephone outreach was attempted today. The patient was referred to the pharmacist for assistance with care management and care coordination.   Follow Up Plan:   [place]

## 2021-07-14 ENCOUNTER — Telehealth: Payer: Self-pay | Admitting: Lab

## 2021-07-14 NOTE — Chronic Care Management (AMB) (Signed)
  Chronic Care Management   Note  07/14/2021 Name: Jeffery Anthony MRN: [id number] DOB: 01-15-1951  Jeffery Anthony is a 70 y.o. year old male who is a primary care patient of Donella Stade, PA-C. I reached out to Loralyn Freshwater by phone today in response to a referral sent by [name]'s PCP, Donella Stade, PA-C.   [name] was given information about Chronic Care Management services today including:  CCM service includes personalized support from designated clinical staff supervised by his physician, including individualized plan of care and coordination with other care providers 24/7 contact phone numbers for assistance for urgent and routine care needs. Service will only be billed when office clinical staff spend 20 minutes or more in a month to coordinate care. Only one practitioner may furnish and bill the service in a calendar month. The patient may stop CCM services at any time (effective at the end of the month) by phone call to the office staff.   Patient agreed to services and verbal consent obtained.   Follow up plan: [place]

## 2021-07-15 ENCOUNTER — Other Ambulatory Visit: Payer: Self-pay

## 2021-07-15 ENCOUNTER — Ambulatory Visit (INDEPENDENT_AMBULATORY_CARE_PROVIDER_SITE_OTHER): Payer: Medicare HMO | Admitting: Physician Assistant

## 2021-07-15 ENCOUNTER — Encounter: Payer: Self-pay | Admitting: Physician Assistant

## 2021-07-15 VITALS — BP 128/84 | HR 64 | Ht 71.0 in | Wt 209.0 lb

## 2021-07-15 DIAGNOSIS — Z0000 Encounter for general adult medical examination without abnormal findings: Secondary | ICD-10-CM

## 2021-07-15 DIAGNOSIS — N529 Male erectile dysfunction, unspecified: Secondary | ICD-10-CM

## 2021-07-15 DIAGNOSIS — E785 Hyperlipidemia, unspecified: Secondary | ICD-10-CM

## 2021-07-15 DIAGNOSIS — R7303 Prediabetes: Secondary | ICD-10-CM

## 2021-07-15 DIAGNOSIS — E538 Deficiency of other specified B group vitamins: Secondary | ICD-10-CM | POA: Diagnosis not present

## 2021-07-15 DIAGNOSIS — Z1329 Encounter for screening for other suspected endocrine disorder: Secondary | ICD-10-CM | POA: Diagnosis not present

## 2021-07-15 DIAGNOSIS — M1711 Unilateral primary osteoarthritis, right knee: Secondary | ICD-10-CM | POA: Diagnosis not present

## 2021-07-15 DIAGNOSIS — Z125 Encounter for screening for malignant neoplasm of prostate: Secondary | ICD-10-CM | POA: Diagnosis not present

## 2021-07-15 MED ORDER — SILDENAFIL CITRATE 100 MG PO TABS: 100.0000 mg | ORAL_TABLET | ORAL | 5 refills | Status: DC | PRN

## 2021-07-15 NOTE — Progress Notes (Signed)
Subjective:    Patient ID: Jeffery Anthony, male    DOB: 08-Oct-1951, 70 y.o.   MRN: [id number]  HPI Patient is a 70 year old male who presents to the clinic to follow-up for complete physical.  Patient does need his B12 rechecked.  He has B12 deficiency and only taking oral 1000 MCG since July.  When his shots were stopped.  He has not noticed any significant symptomatic decline since then. B12 oral.   He does report he is seeing Ortho for a few musculoskeletal needs but none that need to be addressed today.  .. 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 ([place]) 07/07/2017   Benign paroxysmal positional vertigo of right ear 11/17/2017   Status post left knee replacement 12/28/2018   Low libido 12/28/2018   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   Urinary frequency 12/28/2018   Pain in both hands 05/15/2019   Numbness and tingling in both hands 05/15/2019   Paresthesia of both hands 05/15/2019   Orthostatic hypotension 12/11/2019   Right arm pain 12/11/2019   Family history of TIAs 12/11/2019   Episode of dizziness 12/11/2019   Memory changes 12/11/2019   History of COVID-19 12/11/2019   Hyperlipidemia LDL goal <100 12/12/2019   B12 deficiency 12/12/2019   Pre-diabetes 12/13/2019   Trouble in sleeping 01/10/2020   Neck pain 01/10/2020   Pernicious anemia 01/10/2020   C7 radiculopathy 02/07/2020   Elevated LDL cholesterol level 07/16/2020   Seborrheic keratosis 07/16/2020   Acute pain of left shoulder 11/26/2020   Rib pain on left side 11/26/2020   Rash and nonspecific skin eruption 11/26/2020   Complete rotator cuff tear of left shoulder 11/26/2020   Closed fracture of one rib of left side 11/26/2020   Resolved Ambulatory Problems    Diagnosis Date Noted   Primary osteoarthritis of left knee 09/16/2018    Word finding difficulty 12/11/2019   Numbness around mouth 12/11/2019   Past Medical History:  Diagnosis Date   Arthritis    GERD (gastroesophageal reflux disease)    H/O Guillain-Barre syndrome    Headache    Hyperlipidemia    .Marland Kitchen Family History  Problem Relation Age of Onset   Hyperlipidemia Father    Hypertension Father    Heart attack Father    Valvular heart disease Father    Other Father        somr part of intestines  removed- ? etiology    Colon polyps Father    Colon cancer Neg Hx    Esophageal cancer Neg Hx    Rectal cancer Neg Hx    Stomach cancer Neg Hx      Review of Systems  All other systems reviewed and are negative.     Objective:   Physical Exam  BP 128/84   Pulse 64   Ht 5\' 11"  (1.803 m)   Wt 209 lb (94.8 kg)   SpO2 97%   BMI 29.15 kg/m   General Appearance:    Alert, cooperative, no distress, appears stated age  Head:    Normocephalic, without obvious abnormality, atraumatic  Eyes:    PERRL, conjunctiva/corneas clear, EOM's intact, fundi    benign, both eyes       Ears:    Normal TM's and external ear canals, both ears  Nose:   Nares normal, septum midline, mucosa normal,  no drainage    or sinus tenderness  Throat:   Lips, mucosa, and tongue normal; teeth and gums normal  Neck:   Supple, symmetrical, trachea midline, no adenopathy;       thyroid:  No enlargement/tenderness/nodules; no carotid   bruit or JVD  Back:     Symmetric, no curvature, ROM normal, no CVA tenderness  Lungs:     Clear to auscultation bilaterally, respirations unlabored  Chest wall:    No tenderness or deformity  Heart:    Regular rate and rhythm, S1 and S2 normal, no murmur, rub   or gallop  Abdomen:     Soft, non-tender, bowel sounds active all four quadrants,    no masses, no organomegaly        Extremities:   Extremities normal, atraumatic, no cyanosis or edema  Pulses:   2+ and symmetric all extremities  Skin:   Skin color, texture, turgor normal, no rashes  or lesions. Excessive sun exposure.   Lymph nodes:   Cervical, supraclavicular, and axillary nodes normal  Neurologic:   CNII-XII intact. Normal strength, sensation and reflexes      throughout   .Marland Kitchen Depression screen [facility] 2/9 07/15/2021 05/07/2021 03/06/2021 07/16/2020 03/11/2020  Decreased Interest 0 0 0 0 0  Down, Depressed, Hopeless 0 0 0 0 0  PHQ - 2 Score 0 0 0 0 0  Altered sleeping - - - 0 0  Tired, decreased energy - - - 0 0  Change in appetite - - - 0 0  Feeling bad or failure about yourself  - - - 0 0  Trouble concentrating - - - 0 0  Moving slowly or fidgety/restless - - - 0 0  Suicidal thoughts - - - - 0  PHQ-9 Score - - - 0 0  Difficult doing work/chores - - - Not difficult at all Not difficult at all   .Marland Kitchen GAD 7 : Generalized Anxiety Score 07/16/2020 03/11/2020  Nervous, Anxious, on Edge 0 0  Control/stop worrying 0 0  Worry too much - different things 0 0  Trouble relaxing 0 0  Restless 0 0  Easily annoyed or irritable 0 1  Afraid - awful might happen 0 0  Total GAD 7 Score 0 1  Anxiety Difficulty Not difficult at all Not difficult at all         Assessment & Plan:  Marland KitchenMarland KitchenKaranvir was seen today for annual exam.  Diagnoses and all orders for this visit:  Routine physical examination -     PSA -     TSH -     Lipid Panel w/reflex Direct LDL -     COMPLETE METABOLIC PANEL WITH GFR -     CBC with Differential/Platelet -     Hemoglobin A1c -     Vitamin B12  Hyperlipidemia LDL goal <100 -     Lipid Panel w/reflex Direct LDL  Thyroid disorder screen -     TSH  Screening PSA (prostate specific antigen) -     PSA  Pre-diabetes -     COMPLETE METABOLIC PANEL WITH GFR -     Hemoglobin A1c  B12 deficiency -     Vitamin B12  ED (erectile dysfunction) of organic origin -     sildenafil (VIAGRA) 100 MG tablet; Take 1 tablet (100 mg total) by mouth as needed for erectile dysfunction (for use prior to sexual activity).   Marland Kitchen.Start a regular exercise program and  make sure you are eating a  healthy diet Try to eat 4 servings of dairy a day or take a calcium supplement (500mg  twice a day). PHQ and GAD with no concerns. Fasting labs ordered today. Colonoscopy up-to-date and on a yearly screening with next 1 due 11/2021. Patient does not qualify for any vaccines due to his history of Guillain-Barr a vaccine reaction.  B12 to be rechecked today.

## 2021-07-15 NOTE — Patient Instructions (Signed)
Health Maintenance After Age 69 After age 69, you are at a higher risk for certain long-term diseases and infections as well as injuries from falls. Falls are a major cause of broken bones and head injuries in people who are older than age 69. Getting regular preventive care can help to keep you healthy and well. Preventive care includes getting regular testing and making lifestyle changes as recommended by your health care provider. Talk with your health care provider about: Which screenings and tests you should have. A screening is a test that checks for a disease when you have no symptoms. A diet and exercise plan that is right for you. What should I know about screenings and tests to prevent falls? Screening and testing are the best ways to find a health problem early. Early diagnosis and treatment give you the best chance of managing medical conditions that are common after age 69. Certain conditions and lifestyle choices may make you more likely to have a fall. Your health care provider may recommend: Regular vision checks. Poor vision and conditions such as cataracts can make you more likely to have a fall. If you wear glasses, make sure to get your prescription updated if your vision changes. Medicine review. Work with your health care provider to regularly review all of the medicines you are taking, including over-the-counter medicines. Ask your health care provider about any side effects that may make you more likely to have a fall. Tell your health care provider if any medicines that you take make you feel dizzy or sleepy. Osteoporosis screening. Osteoporosis is a condition that causes the bones to get weaker. This can make the bones weak and cause them to break more easily. Blood pressure screening. Blood pressure changes and medicines to control blood pressure can make you feel dizzy. Strength and balance checks. Your health care provider may recommend certain tests to check your strength and  balance while standing, walking, or changing positions. Foot health exam. Foot pain and numbness, as well as not wearing proper footwear, can make you more likely to have a fall. Depression screening. You may be more likely to have a fall if you have a fear of falling, feel emotionally low, or feel unable to do activities that you used to do. Alcohol use screening. Using too much alcohol can affect your balance and may make you more likely to have a fall. What actions can I take to lower my risk of falls? General instructions Talk with your health care provider about your risks for falling. Tell your health care provider if: You fall. Be sure to tell your health care provider about all falls, even ones that seem minor. You feel dizzy, sleepy, or off-balance. Take over-the-counter and prescription medicines only as told by your health care provider. These include any supplements. Eat a healthy diet and maintain a healthy weight. A healthy diet includes low-fat dairy products, low-fat (lean) meats, and fiber from whole grains, beans, and lots of fruits and vegetables. Home safety Remove any tripping hazards, such as rugs, cords, and clutter. Install safety equipment such as grab bars in bathrooms and safety rails on stairs. Keep rooms and walkways well-lit. Activity  Follow a regular exercise program to stay fit. This will help you maintain your balance. Ask your health care provider what types of exercise are appropriate for you. If you need a cane or walker, use it as recommended by your health care provider. Wear supportive shoes that have nonskid soles. Lifestyle Do not   drink alcohol if your health care provider tells you not to drink. If you drink alcohol, limit how much you have: 0-1 drink a day for women. 0-2 drinks a day for men. Be aware of how much alcohol is in your drink. In the [place] one drink equals one typical bottle of beer (12 oz), one-half glass of wine (5 oz), or one shot of  hard liquor (1 oz). Do not use any products that contain nicotine or tobacco, such as cigarettes and e-cigarettes. If you need help quitting, ask your health care provider. Summary Having a healthy lifestyle and getting preventive care can help to protect your health and wellness after age 69. Screening and testing are the best way to find a health problem early and help you avoid having a fall. Early diagnosis and treatment give you the best chance for managing medical conditions that are more common for people who are older than age 69. Falls are a major cause of broken bones and head injuries in people who are older than age 69. Take precautions to prevent a fall at home. Work with your health care provider to learn what changes you can make to improve your health and wellness and to prevent falls. This information is not intended to replace advice given to you by your health care provider. Make sure you discuss any questions you have with your health care provider. Document Revised: 12/20/2020 Document Reviewed: 09/27/2020 Elsevier Patient Education  2022 Elsevier Inc.  

## 2021-07-16 ENCOUNTER — Encounter: Payer: Self-pay | Admitting: Physician Assistant

## 2021-07-16 DIAGNOSIS — R748 Abnormal levels of other serum enzymes: Secondary | ICD-10-CM | POA: Insufficient documentation

## 2021-07-16 DIAGNOSIS — R7989 Other specified abnormal findings of blood chemistry: Secondary | ICD-10-CM | POA: Insufficient documentation

## 2021-07-16 LAB — CBC WITH DIFFERENTIAL/PLATELET
Absolute Monocytes: 748 cells/uL (ref 200–950)
Basophils Absolute: 102 cells/uL (ref 0–200)
Basophils Relative: 1.2 %
Eosinophils Absolute: 213 cells/uL (ref 15–500)
Eosinophils Relative: 2.5 %
HCT: 49.4 % (ref 38.5–50.0)
Hemoglobin: 16.5 g/dL (ref 13.2–17.1)
Lymphs Abs: 2108 cells/uL (ref 850–3900)
MCH: 32.6 pg (ref 27.0–33.0)
MCHC: 33.4 g/dL (ref 32.0–36.0)
MCV: 97.6 fL (ref 80.0–100.0)
MPV: 10.8 fL (ref 7.5–12.5)
Monocytes Relative: 8.8 %
Neutro Abs: 5330 cells/uL (ref 1500–7800)
Neutrophils Relative %: 62.7 %
Platelets: 244 10*3/uL (ref 140–400)
RBC: 5.06 10*6/uL (ref 4.20–5.80)
RDW: 12.7 % (ref 11.0–15.0)
Total Lymphocyte: 24.8 %
WBC: 8.5 10*3/uL (ref 3.8–10.8)

## 2021-07-16 LAB — COMPLETE METABOLIC PANEL WITH GFR
AG Ratio: 2.2 (calc) (ref 1.0–2.5)
ALT: 15 U/L (ref 9–46)
AST: 17 U/L (ref 10–35)
Albumin: 4.4 g/dL (ref 3.6–5.1)
Alkaline phosphatase (APISO): 56 U/L (ref 35–144)
BUN/Creatinine Ratio: 24 (calc) — ABNORMAL HIGH (ref 6–22)
BUN: 26 mg/dL — ABNORMAL HIGH (ref 7–25)
CO2: 27 mmol/L (ref 20–32)
Calcium: 10.1 mg/dL (ref 8.6–10.3)
Chloride: 108 mmol/L (ref 98–110)
Creat: 1.07 mg/dL (ref 0.70–1.35)
Globulin: 2 g/dL (calc) (ref 1.9–3.7)
Glucose, Bld: 103 mg/dL — ABNORMAL HIGH (ref 65–99)
Potassium: 5 mmol/L (ref 3.5–5.3)
Sodium: 143 mmol/L (ref 135–146)
Total Bilirubin: 0.7 mg/dL (ref 0.2–1.2)
Total Protein: 6.4 g/dL (ref 6.1–8.1)
eGFR: 76 mL/min/{1.73_m2} (ref >=60)

## 2021-07-16 LAB — LIPID PANEL W/REFLEX DIRECT LDL
Cholesterol: 131 mg/dL (ref <200)
HDL: 49 mg/dL (ref >=40)
LDL Cholesterol (Calc): 66 mg/dL (calc)
Non-HDL Cholesterol (Calc): 82 mg/dL (calc) (ref <130)
Total CHOL/HDL Ratio: 2.7 (calc) (ref <5.0)
Triglycerides: 79 mg/dL (ref <150)

## 2021-07-16 LAB — PSA: PSA: 3.09 ng/mL (ref <=4.00)

## 2021-07-16 LAB — TSH: TSH: 1.32 mIU/L (ref 0.40–4.50)

## 2021-07-16 LAB — HEMOGLOBIN A1C
Hgb A1c MFr Bld: 5.7 % of total Hgb — ABNORMAL HIGH (ref <5.7)
Mean Plasma Glucose: 117 mg/dL
eAG (mmol/L): 6.5 mmol/L

## 2021-07-16 LAB — VITAMIN B12: Vitamin B-12: >2000 pg/mL — ABNORMAL HIGH (ref 200–1100)

## 2021-07-16 NOTE — Progress Notes (Signed)
Bill,   PSA is still in normal range but has increased by 1ng/mL in 1 year. I would suggest we start a medication to help with prostate symptoms and recheck PSA in 3 months.   You b12 level is still significantly elevated to not have had any shots. Stop all oral b12 as well and recheck in 2 months. Make sure nothing you are taking daily has b12 in it.   A1C in pre-diabetes range. Start now limiting carbs and sugars in diet to avoid progression into diabetes. Recheck in 6 months.   Cholesterol looks fantastic.   Thyroid looks good.   BUN elevated usually means dehydration. Make sure drinking enough water.

## 2021-08-08 DIAGNOSIS — Z0100 Encounter for examination of eyes and vision without abnormal findings: Secondary | ICD-10-CM | POA: Diagnosis not present

## 2021-08-08 DIAGNOSIS — H524 Presbyopia: Secondary | ICD-10-CM | POA: Diagnosis not present

## 2021-08-12 DIAGNOSIS — Y999 Unspecified external cause status: Secondary | ICD-10-CM | POA: Diagnosis not present

## 2021-08-12 DIAGNOSIS — M24112 Other articular cartilage disorders, left shoulder: Secondary | ICD-10-CM | POA: Diagnosis not present

## 2021-08-12 DIAGNOSIS — X58XXXA Exposure to other specified factors, initial encounter: Secondary | ICD-10-CM | POA: Diagnosis not present

## 2021-08-12 DIAGNOSIS — G8918 Other acute postprocedural pain: Secondary | ICD-10-CM | POA: Diagnosis not present

## 2021-08-12 DIAGNOSIS — S46012A Strain of muscle(s) and tendon(s) of the rotator cuff of left shoulder, initial encounter: Secondary | ICD-10-CM | POA: Diagnosis not present

## 2021-08-12 DIAGNOSIS — M7542 Impingement syndrome of left shoulder: Secondary | ICD-10-CM | POA: Diagnosis not present

## 2021-08-25 DIAGNOSIS — M25512 Pain in left shoulder: Secondary | ICD-10-CM | POA: Diagnosis not present

## 2021-08-25 DIAGNOSIS — M25612 Stiffness of left shoulder, not elsewhere classified: Secondary | ICD-10-CM | POA: Diagnosis not present

## 2021-08-25 DIAGNOSIS — M6281 Muscle weakness (generalized): Secondary | ICD-10-CM | POA: Diagnosis not present

## 2021-08-28 ENCOUNTER — Telehealth: Payer: Self-pay

## 2021-08-28 DIAGNOSIS — M25612 Stiffness of left shoulder, not elsewhere classified: Secondary | ICD-10-CM | POA: Diagnosis not present

## 2021-08-28 DIAGNOSIS — M25512 Pain in left shoulder: Secondary | ICD-10-CM | POA: Diagnosis not present

## 2021-08-28 DIAGNOSIS — M6281 Muscle weakness (generalized): Secondary | ICD-10-CM | POA: Diagnosis not present

## 2021-08-28 NOTE — Chronic Care Management (AMB) (Signed)
Chronic Care Management Pharmacy Assistant   Name: Erwin Nishiyama  MRN: [id number] DOB: 06-08-1951  Jeffery Anthony is an 70 y.o. year old male who presents for his initial CCM visit with the clinical pharmacist.   Recent office visits:  07/15/21 Iran Planas PA - Seen for routine physical exam - Labs ordered - No medication changes noted - Follow up in 1 year  05/08/21 Hali Marry MD - Seen for pernicious anemia - B 12 injection given in office - Follow up in 1 month  05/07/21 Iran Planas PA - Seen for medicare annual wellness exam - No medication changes noted - Follow up in 1 year 05/05/21 Aundria Mems MD - Seen for chronic left shoulder pain - Referral to orthopedic surgery - MR arthrogram injection given in office - No follow up noted  04/17/21 Aundria Mems MD - Seen for chronic left shoulder pain - Given HYDROcodone-acetaminophen (NORCO/VICODIN) 5-325 MG tablet - Follow up on 05/05/21 04/03/21 Hali Marry MD - Seen for pernicious anemia - B 12 injection given in office - Follow up in 4 weeks 03/06/21 Hali Marry MD - Seen for B12 deficiency - Follow up in 1 month    Recent consult visits:  05/27/21 Dorna Leitz - Orthopedic surgery - Seen for Unilateral primary osteoarthritis, right knee - No notes available  05/23/21 Tania Ade - Orthopedic surgery - Seen for Complete rotator cuff tear or rupture of left shoulder 04/15/21 [facility] - Physical therapy - Seen for pain in left shoulder - No notes available  03/11/21- 03/26/21 (Various dates) - [place] - Physical therapy - Seen for pain in left shoulder - No notes available     Hospital visits:   Admitted to the hospital on 03/12/21 due to Cervical disc disorder with radiculopathy of cervical region. Discharge date was 03/12/21. Discharged from [facility]?Medications Started at [facility] Discharge:?? Amoxicillin clavulanate  875-125 mg, 1 tablet every 12 hours Prednisone 20 mg, 3 tablets PO daily x 3 days, then 2 tabs PO daily x 3 days, then 1 tab po daily x 3 days    Medication Changes at Hospital Discharge: N/a  Medications Discontinued at Hospital Discharge: N/a   Medications that remain the same after Hospital Discharge:??  -All other medications will remain the same.    Medications: Outpatient Encounter Medications as of 08/28/2021  Medication Sig   aspirin EC 81 MG tablet Take 81 mg by mouth daily.   aspirin-acetaminophen-caffeine (EXCEDRIN MIGRAINE) 250-250-65 MG tablet Take 1 tablet by mouth every 6 (six) hours as needed for migraine.   atorvastatin (LIPITOR) 40 MG tablet Take 1 tablet (40 mg total) by mouth daily.   meloxicam (MOBIC) 15 MG tablet Take 1 tablet (15 mg total) by mouth daily.   sildenafil (VIAGRA) 100 MG tablet Take 1 tablet (100 mg total) by mouth as needed for erectile dysfunction (for use prior to sexual activity).   vitamin B-12 (CYANOCOBALAMIN) 1000 MCG tablet Take 1,000 mcg by mouth daily.   No facility-administered encounter medications on file as of 08/28/2021.    Care Gaps: Pneumonia Vaccine 4+ Years old never done  aspirin EC 81 MG tablet - Last filled 01/04/20 aspirin-acetaminophen-caffeine (EXCEDRIN MIGRAINE) 250-250-65 MG tablet- Last filled 09/08/18 atorvastatin (LIPITOR) 40 MG tablet- Last filled 08/08/2021  meloxicam (MOBIC) 15 MG tablet- Last filled 01/22/2021 30 DS  sildenafil (VIAGRA) 100 MG tablet- Last filled 07/15/21 vitamin B-12 (CYANOCOBALAMIN) 1000 MCG tablet- Last filled  01/04/20    Star Rating Drugs: atorvastatin (LIPITOR) 40 MG tablet- Last filled 08/08/2021    Andee Poles, [place]

## 2021-09-01 ENCOUNTER — Other Ambulatory Visit: Payer: Self-pay

## 2021-09-01 ENCOUNTER — Ambulatory Visit (INDEPENDENT_AMBULATORY_CARE_PROVIDER_SITE_OTHER): Payer: Medicare HMO | Admitting: Pharmacist

## 2021-09-01 DIAGNOSIS — M25512 Pain in left shoulder: Secondary | ICD-10-CM | POA: Diagnosis not present

## 2021-09-01 DIAGNOSIS — M25612 Stiffness of left shoulder, not elsewhere classified: Secondary | ICD-10-CM | POA: Diagnosis not present

## 2021-09-01 DIAGNOSIS — E785 Hyperlipidemia, unspecified: Secondary | ICD-10-CM

## 2021-09-01 DIAGNOSIS — M6281 Muscle weakness (generalized): Secondary | ICD-10-CM | POA: Diagnosis not present

## 2021-09-01 DIAGNOSIS — G8929 Other chronic pain: Secondary | ICD-10-CM

## 2021-09-01 NOTE — Patient Instructions (Signed)
Visit Information   PATIENT GOALS/PLAN OF CARE:  Care Plan : Medication Management  Updates made by Kline, Keesha J, RPH since 09/01/2021 12:00 AM     Problem: HLD, chronic pain      Long-Range Goal: Disease Progression Prevention   Start Date: 09/01/2021  This Visit's Progress: On track  Priority: High  Note:   Current Barriers:  None at present  Pharmacist Clinical Goal(s):  Over the next 365 days, patient will maintain control of chronic conditions as evidenced by medication fill history, lab values, and vital signs  through collaboration with PharmD and provider.   Interventions: 1:1 collaboration with Breeback, Jade L, PA-C regarding development and update of comprehensive plan of care as evidenced by provider attestation and co-signature Inter-disciplinary care team collaboration (see longitudinal plan of care) Comprehensive medication review performed; medication list updated in electronic medical record  Hyperlipidemia:  Controlled; current treatment:atorvastatin 40mg; LDL 66  Recommended continue current regimen  Chronic Pain  Controlled; current treatment: motrin OTC,   Recommended continue current regimen   Patient Goals/Self-Care Activities Over the next 365 days, patient will:  take medications as prescribed  Follow Up Plan: Telephone follow up appointment with care management team member scheduled for:  1 yr      Consent to CCM Services: Mr. Newcomer was given information about Chronic Care Management services including:  CCM service includes personalized support from designated clinical staff supervised by his physician, including individualized plan of care and coordination with other care providers 24/7 contact phone numbers for assistance for urgent and routine care needs. Service will only be billed when office clinical staff spend 20 minutes or more in a month to coordinate care. Only one practitioner may furnish and bill the service in a calendar  month. The patient may stop CCM services at any time (effective at the end of the month) by phone call to the office staff. The patient will be responsible for cost sharing (co-pay) of up to 20% of the service fee (after annual deductible is met).  Patient agreed to services and verbal consent obtained.   Patient verbalizes understanding of instructions provided today and agrees to view in MyChart.   Telephone follow up appointment with care management team member scheduled for: 1 year  Keesha J Kline      

## 2021-09-01 NOTE — Progress Notes (Signed)
Chronic Care Management Pharmacy Note  09/01/2021 Name:  Jeffery Anthony MRN:  [id number] DOB:  [birth year]  Summary: addressed HLD, chronic pain.  Recommendations/Changes made from today's visit: doing well, no changes  Plan: f/u with pharmacist in 1 yr  Subjective: Jeffery Anthony is an 70 y.o. year old male who is a primary patient of Alden Hipp, Royetta Car, PA-C.  The CCM team was consulted for assistance with disease management and care coordination needs.    Engaged with patient by telephone for initial visit in response to provider referral for pharmacy case management and/or care coordination services.   Consent to Services:  The patient was given information about Chronic Care Management services, agreed to services, and gave verbal consent prior to initiation of services.  Please see initial visit note for detailed documentation.   Patient Care Team: Jeffery Anthony as PCP - General (Family Medicine) Jeffery Anthony, [facility] as Pharmacist (Pharmacist)  Recent office visits:  07/15/21 Jeffery Planas PA - Seen for routine physical exam - Labs ordered - No medication changes noted - Follow up in 1 year  05/08/21 Jeffery Marry MD - Seen for pernicious anemia - B 12 injection given in office - Follow up in 1 month  05/07/21 Jeffery Planas PA - Seen for medicare annual wellness exam - No medication changes noted - Follow up in 1 year 05/05/21 Jeffery Mems MD - Seen for chronic left shoulder pain - Referral to orthopedic surgery - MR arthrogram injection given in office - No follow up noted  04/17/21 Jeffery Mems MD - Seen for chronic left shoulder pain - Given HYDROcodone-acetaminophen (NORCO/VICODIN) 5-325 MG tablet - Follow up on 05/05/21 04/03/21 Jeffery Marry MD - Seen for pernicious anemia - B 12 injection given in office - Follow up in 4 weeks 03/06/21 Jeffery Marry MD - Seen for B12 deficiency - Follow up in 1 month      Recent consult visits:   05/27/21 Jeffery Anthony - Orthopedic surgery - Seen for Unilateral primary osteoarthritis, right knee - No notes available  05/23/21 Jeffery Anthony - Orthopedic surgery - Seen for Complete rotator cuff tear or rupture of left shoulder 04/15/21 [facility] - Physical therapy - Seen for pain in left shoulder - No notes available  03/11/21- 03/26/21 (Various dates) - [facility] - Physical therapy - Seen for pain in left shoulder - No notes available        Hospital visits:    Admitted to the hospital on 03/12/21 due to Cervical disc disorder with radiculopathy of cervical region. Discharge date was 03/12/21. Discharged from [facility]?Medications Started at [facility] Discharge:?? Amoxicillin clavulanate 875-125 mg, 1 tablet every 12 hours Prednisone 20 mg, 3 tablets PO daily x 3 days, then 2 tabs PO daily x 3 days, then 1 tab po daily x 3 days      Medication Changes at Hospital Discharge: N/a   Medications Discontinued at Hospital Discharge: N/a    Medications that remain the same after Hospital Discharge:??  -All other medications will remain the same.    Objective:  Lab Results  Component Value Date   CREATININE 1.07 07/15/2021   CREATININE 1.06 08/01/2020   CREATININE 1.10 12/11/2019    Lab Results  Component Value Date   HGBA1C 5.7 (H) 07/15/2021      Component Value Date/Time   CHOL 131 07/15/2021 0000   TRIG 79 07/15/2021 0000   HDL  49 07/15/2021 0000   CHOLHDL 2.7 07/15/2021 0000   VLDL 20 06/28/2013 1619   LDLCALC 66 07/15/2021 0000    Hepatic Function Latest Ref Rng & Units 07/15/2021 08/01/2020 12/11/2019  Total Protein 6.1 - 8.1 g/dL 6.4 6.2 6.6  Albumin 3.5 - 5.0 g/dL - - -  AST 10 - 35 U/L _0 ALT 9 - 46 U/L _1 Alk Phosphatase 38 - 126 U/L - - -  Total Bilirubin 0.2 - 1.2 mg/dL 0.7 0.6 0.7    Lab Results  Component Value Date/Time   TSH 1.32 07/15/2021 12:00 AM   TSH 2.54  12/11/2019 09:26 AM    CBC Latest Ref Rng & Units 07/15/2021 12/11/2019 12/28/2018  WBC 3.8 - 10.8 Thousand/uL 8.5 4.8 5.0  Hemoglobin 13.2 - 17.1 g/dL 16.5 14.1 12.5(L)  Hematocrit 38.5 - 50.0 % 49.4 39.7 34.7(L)  Platelets 140 - 400 Thousand/uL 244 224 204    No results found for: VD25OH  Social History   Tobacco Use  Smoking Status Never  Smokeless Tobacco Never   BP Readings from Last 3 Encounters:  07/15/21 128/84  05/08/21 131/71  04/03/21 137/83   Pulse Readings from Last 3 Encounters:  07/15/21 64  05/08/21 87  04/03/21 (!) 58   Wt Readings from Last 3 Encounters:  07/15/21 209 lb (94.8 kg)  03/06/21 225 lb (102.1 kg)  02/04/21 225 lb (102.1 kg)    Assessment: Review of patient past medical history, allergies, medications, health status, including review of consultants reports, laboratory and other test data, was performed as part of comprehensive evaluation and provision of chronic care management services.   SDOH:  (Social Determinants of Health) assessments and interventions performed:    CCM Care Plan  Allergies  Allergen Reactions   Iodinated Diagnostic Agents Nausea And Vomiting and Other (See Comments)    iodine dye   Oxycodone-Acetaminophen Other (See Comments)    5-318m for shoulder surgery Oct 2022, states gave him severe headaches worse than post-op shoulder pain so does not take    Medications Reviewed Today     [facility] on 09/01/21 at 0[place]sterList Status: <None>   Medication Order Taking? Sig Documenting Provider Last Dose Status Informant  aspirin EC 81 MG tablet [id number] Take 81 mg by mouth daily. [provider] Taking Active   aspirin-acetaminophen-caffeine (EXCEDRIN MIGRAINE) [phone] tablet [id number] Take 1 tablet by mouth every 6 (six) hours as needed for migraine. [provider] Taking Active Self  atorvastatin (LIPITOR) 40 MG tablet [id number] Take 1 tablet (40  mg total) by mouth daily. BDonella Stade PA-C Taking Active   sildenafil (VIAGRA) 100 MG tablet [id number] Take 1 tablet (100 mg total) by mouth as needed for erectile dysfunction (for use prior to sexual activity). BDonella Stade PA-C Taking Active             Patient Active Problem List   Diagnosis Date Noted   Elevated vitamin B12 level 07/16/2021   Acute pain of left shoulder 11/26/2020   Rib pain on left side 11/26/2020   Rash and nonspecific skin eruption 11/26/2020   Complete rotator cuff tear of left shoulder 11/26/2020   Closed fracture of one rib of left side 11/26/2020   Elevated LDL cholesterol level 07/16/2020   Seborrheic keratosis 07/16/2020   C7 radiculopathy 02/07/2020   Trouble in sleeping 01/10/2020   Neck pain 01/10/2020  Pernicious anemia 01/10/2020   Pre-diabetes 12/13/2019   Hyperlipidemia LDL goal <100 12/12/2019   B12 deficiency 12/12/2019   Orthostatic hypotension 12/11/2019   Right arm pain 12/11/2019   Family history of TIAs 12/11/2019   Episode of dizziness 12/11/2019   Memory changes 12/11/2019   History of COVID-19 12/11/2019   Pain in both hands 05/15/2019   Numbness and tingling in both hands 05/15/2019   Paresthesia of both hands 05/15/2019   Status post left knee replacement 12/28/2018   Low libido 12/28/2018   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   Urinary frequency 12/28/2018   Benign paroxysmal positional vertigo of right ear 11/17/2017   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 ([place]) 07/07/2017   Migraine 12/01/2016   Colon polyp 10/04/2013   Primary osteoarthritis of both knees 07/10/2013    Immunization History  Administered Date(s) Administered   Tdap 07/01/2014    Conditions to be addressed/monitored: HLD and chronic pain  Care Plan : Medication Management  Updates made by Jeffery Anthony, [place] since 09/01/2021 12:00 AM     Problem: HLD, chronic pain      Long-Range Goal: Disease  Progression Prevention   Start Date: 09/01/2021  This Visit's Progress: On track  Priority: High  Note:   Current Barriers:  None at present  Pharmacist Clinical Goal(s):  Over the next 365 days, patient will maintain control of chronic conditions as evidenced by medication fill history, lab values, and vital signs  through collaboration with PharmD and provider.   Interventions: 1:1 collaboration with Donella Stade, PA-C regarding development and update of comprehensive plan of care as evidenced by provider attestation and co-signature Inter-disciplinary care team collaboration (see longitudinal plan of care) Comprehensive medication review performed; medication list updated in electronic medical record  Hyperlipidemia:  Controlled; current treatment:atorvastatin 61m; LDL 66  Recommended continue current regimen  Chronic Pain  Controlled; current treatment: motrin OTC,   Recommended continue current regimen   Patient Goals/Self-Care Activities Over the next 365 days, patient will:  take medications as prescribed  Follow Up Plan: Telephone follow up appointment with care management team member scheduled for:  1 yr      Medication Assistance: None required.  Patient affirms current coverage meets needs.  Patient's preferred pharmacy is:  [id number] [place], [place]: [phone]: [phone]: [phone]: [phone] pill box? No - minimal medications Pt endorses 100% compliance  Follow Up:  Patient agrees to Care Plan and Follow-up.  Plan: Telephone follow up appointment with care management team member scheduled for:  1 year  KeLarinda ButteryPharmD Clinical Pharmacist [facility] [phone]

## 2021-09-03 DIAGNOSIS — M6281 Muscle weakness (generalized): Secondary | ICD-10-CM | POA: Diagnosis not present

## 2021-09-03 DIAGNOSIS — M25512 Pain in left shoulder: Secondary | ICD-10-CM | POA: Diagnosis not present

## 2021-09-03 DIAGNOSIS — M25612 Stiffness of left shoulder, not elsewhere classified: Secondary | ICD-10-CM | POA: Diagnosis not present

## 2021-09-09 DIAGNOSIS — M25561 Pain in right knee: Secondary | ICD-10-CM | POA: Diagnosis not present

## 2021-09-09 DIAGNOSIS — M1711 Unilateral primary osteoarthritis, right knee: Secondary | ICD-10-CM | POA: Diagnosis not present

## 2021-09-11 DIAGNOSIS — M25612 Stiffness of left shoulder, not elsewhere classified: Secondary | ICD-10-CM | POA: Diagnosis not present

## 2021-09-11 DIAGNOSIS — M6281 Muscle weakness (generalized): Secondary | ICD-10-CM | POA: Diagnosis not present

## 2021-09-11 DIAGNOSIS — M25512 Pain in left shoulder: Secondary | ICD-10-CM | POA: Diagnosis not present

## 2021-09-15 DIAGNOSIS — M6281 Muscle weakness (generalized): Secondary | ICD-10-CM | POA: Diagnosis not present

## 2021-09-15 DIAGNOSIS — M25612 Stiffness of left shoulder, not elsewhere classified: Secondary | ICD-10-CM | POA: Diagnosis not present

## 2021-09-15 DIAGNOSIS — M25512 Pain in left shoulder: Secondary | ICD-10-CM | POA: Diagnosis not present

## 2021-09-24 DIAGNOSIS — E785 Hyperlipidemia, unspecified: Secondary | ICD-10-CM

## 2021-09-25 DIAGNOSIS — M25512 Pain in left shoulder: Secondary | ICD-10-CM | POA: Diagnosis not present

## 2021-09-25 DIAGNOSIS — M6281 Muscle weakness (generalized): Secondary | ICD-10-CM | POA: Diagnosis not present

## 2021-09-25 DIAGNOSIS — M25561 Pain in right knee: Secondary | ICD-10-CM | POA: Diagnosis not present

## 2021-09-25 DIAGNOSIS — M25612 Stiffness of left shoulder, not elsewhere classified: Secondary | ICD-10-CM | POA: Diagnosis not present

## 2021-09-25 DIAGNOSIS — M1711 Unilateral primary osteoarthritis, right knee: Secondary | ICD-10-CM | POA: Diagnosis not present

## 2021-10-01 DIAGNOSIS — M25612 Stiffness of left shoulder, not elsewhere classified: Secondary | ICD-10-CM | POA: Diagnosis not present

## 2021-10-01 DIAGNOSIS — M6281 Muscle weakness (generalized): Secondary | ICD-10-CM | POA: Diagnosis not present

## 2021-10-01 DIAGNOSIS — M25512 Pain in left shoulder: Secondary | ICD-10-CM | POA: Diagnosis not present

## 2021-10-02 DIAGNOSIS — M1711 Unilateral primary osteoarthritis, right knee: Secondary | ICD-10-CM | POA: Diagnosis not present

## 2021-10-08 DIAGNOSIS — M6281 Muscle weakness (generalized): Secondary | ICD-10-CM | POA: Diagnosis not present

## 2021-10-08 DIAGNOSIS — M25612 Stiffness of left shoulder, not elsewhere classified: Secondary | ICD-10-CM | POA: Diagnosis not present

## 2021-10-08 DIAGNOSIS — M25512 Pain in left shoulder: Secondary | ICD-10-CM | POA: Diagnosis not present

## 2021-10-16 DIAGNOSIS — J00 Acute nasopharyngitis [common cold]: Secondary | ICD-10-CM | POA: Diagnosis not present

## 2021-10-23 DIAGNOSIS — M25612 Stiffness of left shoulder, not elsewhere classified: Secondary | ICD-10-CM | POA: Diagnosis not present

## 2021-10-23 DIAGNOSIS — M25512 Pain in left shoulder: Secondary | ICD-10-CM | POA: Diagnosis not present

## 2021-10-23 DIAGNOSIS — M6281 Muscle weakness (generalized): Secondary | ICD-10-CM | POA: Diagnosis not present

## 2021-10-28 DIAGNOSIS — M6281 Muscle weakness (generalized): Secondary | ICD-10-CM | POA: Diagnosis not present

## 2021-10-28 DIAGNOSIS — M25512 Pain in left shoulder: Secondary | ICD-10-CM | POA: Diagnosis not present

## 2021-10-28 DIAGNOSIS — M25612 Stiffness of left shoulder, not elsewhere classified: Secondary | ICD-10-CM | POA: Diagnosis not present

## 2021-11-11 DIAGNOSIS — M6281 Muscle weakness (generalized): Secondary | ICD-10-CM | POA: Diagnosis not present

## 2021-11-11 DIAGNOSIS — M25512 Pain in left shoulder: Secondary | ICD-10-CM | POA: Diagnosis not present

## 2021-11-11 DIAGNOSIS — M25612 Stiffness of left shoulder, not elsewhere classified: Secondary | ICD-10-CM | POA: Diagnosis not present

## 2021-11-13 DIAGNOSIS — M6281 Muscle weakness (generalized): Secondary | ICD-10-CM | POA: Diagnosis not present

## 2021-11-13 DIAGNOSIS — M25612 Stiffness of left shoulder, not elsewhere classified: Secondary | ICD-10-CM | POA: Diagnosis not present

## 2021-11-13 DIAGNOSIS — M25512 Pain in left shoulder: Secondary | ICD-10-CM | POA: Diagnosis not present

## 2021-11-25 DIAGNOSIS — M25612 Stiffness of left shoulder, not elsewhere classified: Secondary | ICD-10-CM | POA: Diagnosis not present

## 2021-11-25 DIAGNOSIS — M6281 Muscle weakness (generalized): Secondary | ICD-10-CM | POA: Diagnosis not present

## 2021-11-25 DIAGNOSIS — M25512 Pain in left shoulder: Secondary | ICD-10-CM | POA: Diagnosis not present

## 2021-11-27 DIAGNOSIS — M6281 Muscle weakness (generalized): Secondary | ICD-10-CM | POA: Diagnosis not present

## 2021-11-27 DIAGNOSIS — M25512 Pain in left shoulder: Secondary | ICD-10-CM | POA: Diagnosis not present

## 2021-11-27 DIAGNOSIS — M25612 Stiffness of left shoulder, not elsewhere classified: Secondary | ICD-10-CM | POA: Diagnosis not present

## 2021-12-01 ENCOUNTER — Other Ambulatory Visit: Payer: Self-pay | Admitting: Physician Assistant

## 2021-12-08 DIAGNOSIS — M6281 Muscle weakness (generalized): Secondary | ICD-10-CM | POA: Diagnosis not present

## 2021-12-08 DIAGNOSIS — M25512 Pain in left shoulder: Secondary | ICD-10-CM | POA: Diagnosis not present

## 2021-12-08 DIAGNOSIS — M25612 Stiffness of left shoulder, not elsewhere classified: Secondary | ICD-10-CM | POA: Diagnosis not present

## 2021-12-10 DIAGNOSIS — M25512 Pain in left shoulder: Secondary | ICD-10-CM | POA: Diagnosis not present

## 2021-12-11 DIAGNOSIS — M25612 Stiffness of left shoulder, not elsewhere classified: Secondary | ICD-10-CM | POA: Diagnosis not present

## 2021-12-11 DIAGNOSIS — M25512 Pain in left shoulder: Secondary | ICD-10-CM | POA: Diagnosis not present

## 2021-12-11 DIAGNOSIS — M6281 Muscle weakness (generalized): Secondary | ICD-10-CM | POA: Diagnosis not present

## 2021-12-17 DIAGNOSIS — M25612 Stiffness of left shoulder, not elsewhere classified: Secondary | ICD-10-CM | POA: Diagnosis not present

## 2021-12-17 DIAGNOSIS — M25512 Pain in left shoulder: Secondary | ICD-10-CM | POA: Diagnosis not present

## 2021-12-17 DIAGNOSIS — M6281 Muscle weakness (generalized): Secondary | ICD-10-CM | POA: Diagnosis not present

## 2021-12-25 DIAGNOSIS — M25512 Pain in left shoulder: Secondary | ICD-10-CM | POA: Diagnosis not present

## 2021-12-25 DIAGNOSIS — M6281 Muscle weakness (generalized): Secondary | ICD-10-CM | POA: Diagnosis not present

## 2021-12-25 DIAGNOSIS — M25612 Stiffness of left shoulder, not elsewhere classified: Secondary | ICD-10-CM | POA: Diagnosis not present

## 2021-12-30 ENCOUNTER — Encounter: Payer: Self-pay | Admitting: Physician Assistant

## 2021-12-31 ENCOUNTER — Encounter: Payer: Self-pay | Admitting: Internal Medicine

## 2022-01-01 DIAGNOSIS — M25512 Pain in left shoulder: Secondary | ICD-10-CM | POA: Diagnosis not present

## 2022-01-01 DIAGNOSIS — M25612 Stiffness of left shoulder, not elsewhere classified: Secondary | ICD-10-CM | POA: Diagnosis not present

## 2022-01-01 DIAGNOSIS — M6281 Muscle weakness (generalized): Secondary | ICD-10-CM | POA: Diagnosis not present

## 2022-01-20 DIAGNOSIS — M1711 Unilateral primary osteoarthritis, right knee: Secondary | ICD-10-CM | POA: Diagnosis not present

## 2022-02-02 ENCOUNTER — Encounter: Payer: Self-pay | Admitting: Physician Assistant

## 2022-02-11 ENCOUNTER — Ambulatory Visit (INDEPENDENT_AMBULATORY_CARE_PROVIDER_SITE_OTHER): Payer: Medicare HMO | Admitting: Physician Assistant

## 2022-02-11 ENCOUNTER — Encounter: Payer: Self-pay | Admitting: Physician Assistant

## 2022-02-11 VITALS — BP 121/76 | HR 58 | Resp 18 | Ht 71.0 in | Wt 228.0 lb

## 2022-02-11 DIAGNOSIS — R001 Bradycardia, unspecified: Secondary | ICD-10-CM | POA: Diagnosis not present

## 2022-02-11 DIAGNOSIS — R7303 Prediabetes: Secondary | ICD-10-CM

## 2022-02-11 DIAGNOSIS — D510 Vitamin B12 deficiency anemia due to intrinsic factor deficiency: Secondary | ICD-10-CM | POA: Diagnosis not present

## 2022-02-11 DIAGNOSIS — Z01818 Encounter for other preprocedural examination: Secondary | ICD-10-CM

## 2022-02-11 DIAGNOSIS — Z5181 Encounter for therapeutic drug level monitoring: Secondary | ICD-10-CM | POA: Diagnosis not present

## 2022-02-11 NOTE — Progress Notes (Signed)
? ?Established Patient Office Visit ? ?Subjective   ?Patient ID: Jeffery Anthony, male    DOB: [birth year]  Age: 71 y.o. MRN: [id number] ? ?Chief Complaint  ?Patient presents with  ? Surgical Clearance   ?  Patient is scheduled for surgery on 03/11/22 for right knee replacement.   ? ? ?HPI ?Pt needs surgical clearance. No problems or concerns today.  ? ?.. ?Active Ambulatory Problems  ?  Diagnosis Date Noted  ? Primary osteoarthritis of both knees 07/10/2013  ? Colon polyp 10/04/2013  ? Migraine 12/01/2016  ? 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 Benign paroxysmal positional vertigo of right ear 11/17/2017  ? Status post left knee replacement 12/28/2018  ? Low libido 12/28/2018  ? 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 Urinary frequency 12/28/2018  ? Pain in both hands 05/15/2019  ? Numbness and tingling in both hands 05/15/2019  ? Paresthesia of both hands 05/15/2019  ? Orthostatic hypotension 12/11/2019  ? Right arm pain 12/11/2019  ? Family history of TIAs 12/11/2019  ? Episode of dizziness 12/11/2019  ? Memory changes 12/11/2019  ? History of COVID-19 12/11/2019  ? Hyperlipidemia LDL goal <100 12/12/2019  ? B12 deficiency 12/12/2019  ? Pre-diabetes 12/13/2019  ? Trouble in sleeping 01/10/2020  ? Neck pain 01/10/2020  ? Pernicious anemia 01/10/2020  ? C7 radiculopathy 02/07/2020  ? Elevated LDL cholesterol level 07/16/2020  ? Seborrheic keratosis 07/16/2020  ? Acute pain of left shoulder 11/26/2020  ? Rib pain on left side 11/26/2020  ? Rash and nonspecific skin eruption 11/26/2020  ? Complete rotator cuff tear of left shoulder 11/26/2020  ? Closed fracture of one rib of left side 11/26/2020  ? Elevated vitamin B12 level 07/16/2021  ? Sinus bradycardia 02/11/2022  ? ?Resolved Ambulatory Problems  ?  Diagnosis Date Noted  ? Primary osteoarthritis of left knee 09/16/2018  ? Word finding difficulty 12/11/2019  ? Numbness around mouth 12/11/2019  ? ?Past Medical History:   ?Diagnosis Date  ? Arthritis   ? GERD (gastroesophageal reflux disease)   ? H/O Guillain-Barre syndrome   ? Headache   ? Hyperlipidemia   ? ? ? ?Review of Systems  ?All other systems reviewed and are negative. ? ?  ?Objective:  ?  ? ?BP 121/76   Pulse (!) 58   Resp 18   Ht '5\' 11"'$  (1.803 m)   Wt 228 lb (103.4 kg)   SpO2 97%   BMI 31.80 kg/m?  ? ? ?Physical Exam ?Vitals reviewed.  ?Constitutional:   ?   Appearance: Normal appearance.  ?HENT:  ?   Head: Normocephalic.  ?Neck:  ?   Vascular: No carotid bruit.  ?Cardiovascular:  ?   Rate and Rhythm: Normal rate and regular rhythm.  ?   Pulses: Normal pulses.  ?   Heart sounds: Normal heart sounds.  ?Pulmonary:  ?   Effort: Pulmonary effort is normal.  ?   Breath sounds: Normal breath sounds.  ?Musculoskeletal:  ?   Right lower leg: No edema.  ?   Left lower leg: No edema.  ?Lymphadenopathy:  ?   Cervical: No cervical adenopathy.  ?Neurological:  ?   General: No focal deficit present.  ?   Mental Status: He is alert and oriented to person, place, and time.  ?Psychiatric:     ?   Mood and Affect: Mood normal.  ? ? ? ?The 10-year ASCVD risk score (Arnett DK, et al., 2019) is: 12.4% ? ?  ?  Assessment & Plan:  ?..Jeffery Anthony was seen today for surgical clearance . ? ?Diagnoses and all orders for this visit: ? ?Preop examination ?-     EKG 12-Lead ?-     CBC w/Diff/Platelet ?-     COMPLETE METABOLIC PANEL WITH GFR ?-     Hemoglobin A1c ?-     INR/PT ? ?Pernicious anemia ? ?Pre-diabetes ?-     Hemoglobin A1c ? ?Sinus bradycardia ? ? ?EKG Sinus Bradycardia with sinus arrhythmia. Reviewed last EKG with no changes seen.  ?Labs ordered. ?After review will send clearance paperwork. ? ?Encouraged patient to restart lipitor to decrease CV risk.  ? ? ?Iran Planas, PA-C ? ?

## 2022-02-11 NOTE — Patient Instructions (Signed)
Cleared for surgery. Will Fax paperwork.  ?

## 2022-02-12 ENCOUNTER — Encounter: Payer: Self-pay | Admitting: Physician Assistant

## 2022-02-12 LAB — CBC WITH DIFFERENTIAL/PLATELET
Absolute Monocytes: 656 cells/uL (ref 200–950)
Basophils Absolute: 90 cells/uL (ref 0–200)
Basophils Relative: 1.3 %
Eosinophils Absolute: 186 cells/uL (ref 15–500)
Eosinophils Relative: 2.7 %
HCT: 47.2 % (ref 38.5–50.0)
Hemoglobin: 15.8 g/dL (ref 13.2–17.1)
Lymphs Abs: 1904 cells/uL (ref 850–3900)
MCH: 32.2 pg (ref 27.0–33.0)
MCHC: 33.5 g/dL (ref 32.0–36.0)
MCV: 96.3 fL (ref 80.0–100.0)
MPV: 10.6 fL (ref 7.5–12.5)
Monocytes Relative: 9.5 %
Neutro Abs: 4064 cells/uL (ref 1500–7800)
Neutrophils Relative %: 58.9 %
Platelets: 250 10*3/uL (ref 140–400)
RBC: 4.9 10*6/uL (ref 4.20–5.80)
RDW: 13.5 % (ref 11.0–15.0)
Total Lymphocyte: 27.6 %
WBC: 6.9 10*3/uL (ref 3.8–10.8)

## 2022-02-12 LAB — COMPLETE METABOLIC PANEL WITH GFR
AG Ratio: 2.2 (calc) (ref 1.0–2.5)
ALT: 16 U/L (ref 9–46)
AST: 16 U/L (ref 10–35)
Albumin: 4.4 g/dL (ref 3.6–5.1)
Alkaline phosphatase (APISO): 58 U/L (ref 35–144)
BUN: 19 mg/dL (ref 7–25)
CO2: 24 mmol/L (ref 20–32)
Calcium: 9.2 mg/dL (ref 8.6–10.3)
Chloride: 107 mmol/L (ref 98–110)
Creat: 1.07 mg/dL (ref 0.70–1.35)
Globulin: 2 g/dL (calc) (ref 1.9–3.7)
Glucose, Bld: 91 mg/dL (ref 65–139)
Potassium: 4.5 mmol/L (ref 3.5–5.3)
Sodium: 141 mmol/L (ref 135–146)
Total Bilirubin: 0.5 mg/dL (ref 0.2–1.2)
Total Protein: 6.4 g/dL (ref 6.1–8.1)
eGFR: 75 mL/min/{1.73_m2} (ref >=60)

## 2022-02-12 LAB — PROTIME-INR
INR: 0.9
Prothrombin Time: 9.7 s (ref 9.0–11.5)

## 2022-02-12 LAB — HEMOGLOBIN A1C
Hgb A1c MFr Bld: 5.8 % of total Hgb — ABNORMAL HIGH (ref <5.7)
Mean Plasma Glucose: 120 mg/dL
eAG (mmol/L): 6.6 mmol/L

## 2022-02-12 NOTE — Progress Notes (Signed)
Labs look great. JJ please wait to fax his forms until tomorrow. I want to explain the EKG on forms with clearance.

## 2022-03-02 DIAGNOSIS — M25561 Pain in right knee: Secondary | ICD-10-CM | POA: Diagnosis not present

## 2022-03-02 DIAGNOSIS — M1711 Unilateral primary osteoarthritis, right knee: Secondary | ICD-10-CM | POA: Diagnosis not present

## 2022-03-11 DIAGNOSIS — G8918 Other acute postprocedural pain: Secondary | ICD-10-CM | POA: Diagnosis not present

## 2022-03-11 DIAGNOSIS — Z96651 Presence of right artificial knee joint: Secondary | ICD-10-CM | POA: Diagnosis not present

## 2022-03-11 DIAGNOSIS — M1711 Unilateral primary osteoarthritis, right knee: Secondary | ICD-10-CM | POA: Diagnosis not present

## 2022-03-12 DIAGNOSIS — Z96651 Presence of right artificial knee joint: Secondary | ICD-10-CM | POA: Diagnosis not present

## 2022-03-12 DIAGNOSIS — M1711 Unilateral primary osteoarthritis, right knee: Secondary | ICD-10-CM | POA: Diagnosis not present

## 2022-03-13 DIAGNOSIS — M25561 Pain in right knee: Secondary | ICD-10-CM | POA: Diagnosis not present

## 2022-03-13 DIAGNOSIS — Z96651 Presence of right artificial knee joint: Secondary | ICD-10-CM | POA: Diagnosis not present

## 2022-03-13 DIAGNOSIS — M25661 Stiffness of right knee, not elsewhere classified: Secondary | ICD-10-CM | POA: Diagnosis not present

## 2022-03-17 DIAGNOSIS — M25561 Pain in right knee: Secondary | ICD-10-CM | POA: Diagnosis not present

## 2022-03-17 DIAGNOSIS — M25661 Stiffness of right knee, not elsewhere classified: Secondary | ICD-10-CM | POA: Diagnosis not present

## 2022-03-17 DIAGNOSIS — Z96651 Presence of right artificial knee joint: Secondary | ICD-10-CM | POA: Diagnosis not present

## 2022-03-19 DIAGNOSIS — Z96651 Presence of right artificial knee joint: Secondary | ICD-10-CM | POA: Diagnosis not present

## 2022-03-19 DIAGNOSIS — M25661 Stiffness of right knee, not elsewhere classified: Secondary | ICD-10-CM | POA: Diagnosis not present

## 2022-03-19 DIAGNOSIS — M25561 Pain in right knee: Secondary | ICD-10-CM | POA: Diagnosis not present

## 2022-03-24 DIAGNOSIS — M25561 Pain in right knee: Secondary | ICD-10-CM | POA: Diagnosis not present

## 2022-03-24 DIAGNOSIS — Z471 Aftercare following joint replacement surgery: Secondary | ICD-10-CM | POA: Diagnosis not present

## 2022-03-24 DIAGNOSIS — M25661 Stiffness of right knee, not elsewhere classified: Secondary | ICD-10-CM | POA: Diagnosis not present

## 2022-03-24 DIAGNOSIS — Z96651 Presence of right artificial knee joint: Secondary | ICD-10-CM | POA: Diagnosis not present

## 2022-04-03 ENCOUNTER — Encounter: Payer: Self-pay | Admitting: Physician Assistant

## 2022-04-03 DIAGNOSIS — N529 Male erectile dysfunction, unspecified: Secondary | ICD-10-CM

## 2022-04-03 MED ORDER — SILDENAFIL CITRATE 100 MG PO TABS: 100.0000 mg | ORAL_TABLET | ORAL | 0 refills | Status: AC | PRN

## 2022-06-17 ENCOUNTER — Encounter: Payer: Self-pay | Admitting: General Practice

## 2022-08-03 ENCOUNTER — Other Ambulatory Visit: Payer: Self-pay | Admitting: Physician Assistant

## 2022-08-17 ENCOUNTER — Ambulatory Visit: Payer: Self-pay | Admitting: Licensed Clinical Social Worker

## 2022-08-17 NOTE — Patient Outreach (Signed)
  Care Coordination   Initial Visit Note   08/17/2022 Name: Jeffery Anthony MRN: [id number] DOB: Apr 11, 1951  Jeffery Anthony is a 71 y.o. year old male who sees No primary care provider on file. for primary care. I spoke with  Jeffery Anthony by phone today.  What matters to the patients health and wellness today? Client now [place] in [place]             This Visit's Progress    Patient Stated he moved to [place] recently and is no longer in [place] area       Intervention: Informed client of program services Client said he moved out of [place] area and is now living in [place].        SDOH assessments and interventions completed:  Yes  SDOH Interventions Today    Flowsheet Row Most Recent Value  SDOH Interventions   Stress Interventions Other (Comment)  [client moved to [place] recently]        Care Coordination Interventions Activated:  Yes  Care Coordination Interventions:  Yes, provided   Follow up plan: No further intervention required.   Encounter Outcome:  Pt. Visit Completed

## 2022-08-17 NOTE — Patient Instructions (Signed)
Visit Information  Thank you for taking time to visit with me today. Please don't hesitate to contact me if I can be of assistance to you before our next scheduled telephone appointment.  Following are the goals we discussed today:   No further intervention is needed  Please call the care guide team at [phone] if you need to cancel or reschedule your appointment.   If you are experiencing a Mental Health or [place] or need someone to talk to, please go to [facility] [place] [phone])   Following is a copy of your full plan of care:   Informed client of program services Client said he moved out of [place] area and is now living in [place].  [name] was given information about Care Management services by the embedded care coordination team including:  Care Management services include personalized support from designated clinical staff supervised by his physician, including individualized plan of care and coordination with other care providers 24/7 contact phone numbers for assistance for urgent and routine care needs. The patient may stop CCM services at any time (effective at the end of the month) by phone call to the office staff.  Patient agreed to services and verbal consent obtained.   Norva Riffle.Jeffery Anthony MSW, [place] Holiday representative [facility] Care Management [phone]
